# Patient Record
Sex: Female | Born: 1995 | Race: White | Hispanic: No | Marital: Single | Smoking: Former smoker
Health system: Southern US, Community
[De-identification: ages and names within clinical notes are randomized; demographics above are authoritative.]

## PROBLEM LIST (undated history)

## (undated) DIAGNOSIS — J45909 Unspecified asthma, uncomplicated: Secondary | ICD-10-CM

## (undated) DIAGNOSIS — F419 Anxiety disorder, unspecified: Secondary | ICD-10-CM

## (undated) DIAGNOSIS — F329 Major depressive disorder, single episode, unspecified: Secondary | ICD-10-CM

## (undated) DIAGNOSIS — N390 Urinary tract infection, site not specified: Secondary | ICD-10-CM

## (undated) DIAGNOSIS — E282 Polycystic ovarian syndrome: Secondary | ICD-10-CM

## (undated) DIAGNOSIS — F32A Depression, unspecified: Secondary | ICD-10-CM

## (undated) HISTORY — PX: NO PAST SURGERIES: SHX2092

---
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
## 2014-10-07 ENCOUNTER — Emergency Department (HOSPITAL_COMMUNITY)
Admission: EM | Admit: 2014-10-07 | Discharge: 2014-10-07 | Disposition: A | Discharge status: Home / Self Care | Payer: Medicaid Other | Attending: Emergency Medicine | Admitting: Emergency Medicine

## 2014-10-07 ENCOUNTER — Encounter (HOSPITAL_COMMUNITY): Payer: Self-pay | Admitting: Emergency Medicine

## 2014-10-07 DIAGNOSIS — S01512A Laceration without foreign body of oral cavity, initial encounter: Secondary | ICD-10-CM | POA: Diagnosis not present

## 2014-10-07 DIAGNOSIS — Z880 Allergy status to penicillin: Secondary | ICD-10-CM | POA: Insufficient documentation

## 2014-10-07 DIAGNOSIS — S1980XA Other specified injuries of unspecified part of neck, initial encounter: Secondary | ICD-10-CM | POA: Diagnosis present

## 2014-10-07 DIAGNOSIS — S1121XA Laceration without foreign body of pharynx and cervical esophagus, initial encounter: Secondary | ICD-10-CM | POA: Diagnosis not present

## 2014-10-07 DIAGNOSIS — J45909 Unspecified asthma, uncomplicated: Secondary | ICD-10-CM | POA: Insufficient documentation

## 2014-10-07 MED ORDER — LIDOCAINE VISCOUS 2 % MT SOLN
15.0000 mL | Freq: Once | OROMUCOSAL | Status: AC
  Administered 2014-10-07: 15 mL via OROMUCOSAL
  Filled 2014-10-07: qty 15
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
## 2014-10-08 NOTE — ED Provider Notes (Signed)
Medical screening examination/treatment/procedure(s) were performed by non-physician practitioner and as supervising physician I was immediately available for consultation/collaboration.    Anjanae Woehrle, MD 10/08/14 0059 

## 2014-12-26 ENCOUNTER — Emergency Department (HOSPITAL_COMMUNITY)
Admission: EM | Admit: 2014-12-26 | Discharge: 2014-12-26 | Disposition: A | Discharge status: Home / Self Care | Payer: Medicaid Other | Attending: Emergency Medicine | Admitting: Emergency Medicine

## 2014-12-26 ENCOUNTER — Encounter (HOSPITAL_COMMUNITY): Payer: Self-pay | Admitting: Nurse Practitioner

## 2014-12-26 DIAGNOSIS — N939 Abnormal uterine and vaginal bleeding, unspecified: Secondary | ICD-10-CM | POA: Diagnosis present

## 2014-12-26 DIAGNOSIS — Z880 Allergy status to penicillin: Secondary | ICD-10-CM | POA: Diagnosis not present

## 2014-12-26 DIAGNOSIS — Z3202 Encounter for pregnancy test, result negative: Secondary | ICD-10-CM | POA: Insufficient documentation

## 2014-12-26 DIAGNOSIS — J45909 Unspecified asthma, uncomplicated: Secondary | ICD-10-CM | POA: Insufficient documentation

## 2014-12-26 DIAGNOSIS — R102 Pelvic and perineal pain: Secondary | ICD-10-CM | POA: Diagnosis not present

## 2014-12-26 LAB — COMPREHENSIVE METABOLIC PANEL
ALBUMIN: 3.8 g/dL (ref 3.5–5.2)
ALK PHOS: 55 U/L (ref 39–117)
ALT: 21 U/L (ref 0–35)
ANION GAP: 7 (ref 5–15)
AST: 19 U/L (ref 0–37)
BUN: 8 mg/dL (ref 6–23)
CO2: 23 mmol/L (ref 19–32)
Calcium: 9.1 mg/dL (ref 8.4–10.5)
Chloride: 108 mEq/L (ref 96–112)
Creatinine, Ser: 0.61 mg/dL (ref 0.50–1.10)
GLUCOSE: 82 mg/dL (ref 70–99)
Potassium: 4.1 mmol/L (ref 3.5–5.1)
Sodium: 138 mmol/L (ref 135–145)
TOTAL PROTEIN: 7.1 g/dL (ref 6.0–8.3)
Total Bilirubin: 0.3 mg/dL (ref 0.3–1.2)

## 2014-12-26 LAB — CBC
HCT: 37.6 % (ref 36.0–46.0)
Hemoglobin: 12.2 g/dL (ref 12.0–15.0)
MCH: 27.5 pg (ref 26.0–34.0)
MCHC: 32.4 g/dL (ref 30.0–36.0)
MCV: 84.7 fL (ref 78.0–100.0)
Platelets: 311 10*3/uL (ref 150–400)
RBC: 4.44 MIL/uL (ref 3.87–5.11)
RDW: 13 % (ref 11.5–15.5)
WBC: 9.5 10*3/uL (ref 4.0–10.5)

## 2014-12-26 LAB — URINALYSIS, ROUTINE W REFLEX MICROSCOPIC
Bilirubin Urine: NEGATIVE
GLUCOSE, UA: NEGATIVE mg/dL
Ketones, ur: NEGATIVE mg/dL
Leukocytes, UA: NEGATIVE
Nitrite: NEGATIVE
PH: 5 (ref 5.0–8.0)
Protein, ur: NEGATIVE mg/dL
SPECIFIC GRAVITY, URINE: 1.026 (ref 1.005–1.030)
UROBILINOGEN UA: 0.2 mg/dL (ref 0.0–1.0)

## 2014-12-26 LAB — PREGNANCY, URINE: PREG TEST UR: NEGATIVE

## 2014-12-26 LAB — WET PREP, GENITAL
CLUE CELLS WET PREP: NONE SEEN
TRICH WET PREP: NONE SEEN
YEAST WET PREP: NONE SEEN

## 2014-12-26 LAB — URINE MICROSCOPIC-ADD ON

## 2014-12-26 MED ORDER — MEDROXYPROGESTERONE ACETATE 5 MG PO TABS: 10.0000 mg | ORAL_TABLET | Freq: Every day | ORAL | Status: DC

## 2014-12-26 MED ORDER — KETOROLAC TROMETHAMINE 60 MG/2ML IM SOLN
60.0000 mg | Freq: Once | INTRAMUSCULAR | Status: AC
  Administered 2014-12-26: 60 mg via INTRAMUSCULAR
  Filled 2014-12-26: qty 2
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
## 2014-12-26 NOTE — Discharge Instructions (Signed)
Return to the emergency room with worsening of symptoms, new symptoms or with symptoms that are concerning, especially lightheadedness, feeling faint, chest pain, shortness of breath, difficulty breathing, severe worsening of bleeding or abdominal discomfort, blood in stool or vomit.. Provera as prescribed Ibuprofen 400mg  (2 tablets 200mg ) every 5-6 hours for 3-5 days  [facility] Call to make an appointment.    Abnormal Uterine Bleeding Abnormal uterine bleeding can affect women at various stages in life, including teenagers, women in their reproductive years, pregnant women, and women who have reached menopause. Several kinds of uterine bleeding are considered abnormal, including:  Bleeding or spotting between periods.   Bleeding after sexual intercourse.   Bleeding that is heavier or more than normal.   Periods that last longer than usual.  Bleeding after menopause.  Many cases of abnormal uterine bleeding are minor and simple to treat, while others are more serious. Any type of abnormal bleeding should be evaluated by your health care provider. Treatment will depend on the cause of the bleeding. HOME CARE INSTRUCTIONS Monitor your condition for any changes. The following actions may help to alleviate any discomfort you are experiencing:  Avoid the use of tampons and douches as directed by your health care provider.  Change your pads frequently. You should get regular pelvic exams and Pap tests. Keep all follow-up appointments for diagnostic tests as directed by your health care provider.  SEEK MEDICAL CARE IF:   Your bleeding lasts more than 1 week.   You feel dizzy at times.  SEEK IMMEDIATE MEDICAL CARE IF:   You pass out.   You are changing pads every 15 to 30 minutes.   You have abdominal pain.  You have a fever.   You become sweaty or weak.   You are passing large blood clots from the vagina.   You start to feel nauseous and  vomit. MAKE SURE YOU:   Understand these instructions.  Will watch your condition.  Will get help right away if you are not doing well or get worse. Document Released: 12/17/2005 Document Revised: 12/22/2013 Document Reviewed: 07/16/2013 [facility]Exit[facility] Patient Information 2015 [place]Care, [place]. This information is not intended to replace advice given to you by your health care provider. Make sure you discuss any questions you have with your health care provider.   Abdominal Pain, Women Abdominal (stomach, pelvic, or belly) pain can be caused by many things. It is important to tell your doctor:  The location of the pain.  Does it come and go or is it present all the time?  Are there things that start the pain (eating certain foods, exercise)?  Are there other symptoms associated with the pain (fever, nausea, vomiting, diarrhea)? All of this is helpful to know when trying to find the cause of the pain. CAUSES   Stomach: virus or bacteria infection, or ulcer.  Intestine: appendicitis (inflamed appendix), regional ileitis (Crohn's disease), ulcerative colitis (inflamed colon), irritable bowel syndrome, diverticulitis (inflamed diverticulum of the colon), or cancer of the stomach or intestine.  Gallbladder disease or stones in the gallbladder.  Kidney disease, kidney stones, or infection.  Pancreas infection or cancer.  Fibromyalgia (pain disorder).  Diseases of the female organs:  Uterus: fibroid (non-cancerous) tumors or infection.  Fallopian tubes: infection or tubal pregnancy.  Ovary: cysts or tumors.  Pelvic adhesions (scar tissue).  Endometriosis (uterus lining tissue growing in the pelvis and on the pelvic organs).  Pelvic congestion syndrome (female organs filling up with blood just  before the menstrual period).  Pain with the menstrual period.  Pain with ovulation (producing an egg).  Pain with an IUD (intrauterine device, birth control) in the uterus.  Cancer of  the female organs.  Functional pain (pain not caused by a disease, may improve without treatment).  Psychological pain.  Depression. DIAGNOSIS  Your doctor will decide the seriousness of your pain by doing an examination.  Blood tests.  X-rays.  Ultrasound.  CT scan (computed tomography, special type of X-ray).  MRI (magnetic resonance imaging).  Cultures, for infection.  Barium enema (dye inserted in the large intestine, to better view it with X-rays).  Colonoscopy (looking in intestine with a lighted tube).  Laparoscopy (minor surgery, looking in abdomen with a lighted tube).  Major abdominal exploratory surgery (looking in abdomen with a large incision). TREATMENT  The treatment will depend on the cause of the pain.   Many cases can be observed and treated at home.  Over-the-counter medicines recommended by your caregiver.  Prescription medicine.  Antibiotics, for infection.  Birth control pills, for painful periods or for ovulation pain.  Hormone treatment, for endometriosis.  Nerve blocking injections.  Physical therapy.  Antidepressants.  Counseling with a psychologist or psychiatrist.  Minor or major surgery. HOME CARE INSTRUCTIONS   Do not take laxatives, unless directed by your caregiver.  Take over-the-counter pain medicine only if ordered by your caregiver. Do not take aspirin because it can cause an upset stomach or bleeding.  Try a clear liquid diet (broth or water) as ordered by your caregiver. Slowly move to a bland diet, as tolerated, if the pain is related to the stomach or intestine.  Have a thermometer and take your temperature several times a day, and record it.  Bed rest and sleep, if it helps the pain.  Avoid sexual intercourse, if it causes pain.  Avoid stressful situations.  Keep your follow-up appointments and tests, as your caregiver orders.  If the pain does not go away with medicine or surgery, you may  try:  Acupuncture.  Relaxation exercises (yoga, meditation).  Group therapy.  Counseling. SEEK MEDICAL CARE IF:   You notice certain foods cause stomach pain.  Your home care treatment is not helping your pain.  You need stronger pain medicine.  You want your IUD removed.  You feel faint or lightheaded.  You develop nausea and vomiting.  You develop a rash.  You are having side effects or an allergy to your medicine. SEEK IMMEDIATE MEDICAL CARE IF:   Your pain does not go away or gets worse.  You have a fever.  Your pain is felt only in portions of the abdomen. The right side could possibly be appendicitis. The left lower portion of the abdomen could be colitis or diverticulitis.  You are passing blood in your stools (bright red or black tarry stools, with or without vomiting).  You have blood in your urine.  You develop chills, with or without a fever.  You pass out. MAKE SURE YOU:   Understand these instructions.  Will watch your condition.  Will get help right away if you are not doing well or get worse. Document Released: 10/14/2007 Document Revised: 05/03/2014 Document Reviewed: 11/03/2009 [facility]ExitCare Patient Information 2015 [place]ExitCare, [place]LLC. This information is not intended to replace advice given to you by your health care provider. Make sure you discuss any questions you have with your health care provider.

## 2014-12-26 NOTE — ED Provider Notes (Signed)
CSN: [id number]     Arrival date & time 12/26/14  1234 History   First MD Initiated Contact with Patient 12/26/14 1533     Chief Complaint  Patient presents with  . Vaginal Bleeding     (Consider location/radiation/quality/duration/timing/severity/associated sxs/prior Treatment) HPI  Laura Whitaker is a 18 y.o. female with PMH of asthma presenting with heavy vaginal bleeding with clots and pelvic pain for 3 days. Patient states she is using one pad every 1-2 hours. Patient states she has a history of amenorrhea but 2 weeks ago after sexual intercourse she did have some vaginal bleeding that is resolved. Patient states she has never had a regular period. She has contacted OB/GYn. She stated they did do anything for her. She is not taking any oral contraceptive pills or estrogens. She does engage in intercourse and uses condoms regularly. She denies ever having an STD. Patient states the pelvic pain is a "discomfort" she denies anything making it better or worse. She has not taken anything for it. She denies any fevers, chills, nausea, vomiting, diarrhea. Patient denies problems with bleeding before. Patient states she has never been pregnant. Patient eating and drinking like normal. No urinary complaints. Patient endorses mild lightheadedness with standing. No chest pain, shortness of breath or syncope.   Past Medical History  Diagnosis Date  . Asthma    History reviewed. No pertinent past surgical history. Family History  Problem Relation Age of Onset  . Diabetes Mother   . Hypertension Other    History  Substance Use Topics  . Smoking status: Never Smoker   . Smokeless tobacco: Never Used  . Alcohol Use: No   OB History    No data available     Review of Systems  Constitutional: Negative for fever and chills.  HENT: Negative for congestion and rhinorrhea.   Eyes: Negative for visual disturbance.  Respiratory: Negative for cough and shortness of breath.   Cardiovascular:  Negative for chest pain and palpitations.  Gastrointestinal: Negative for nausea, vomiting and diarrhea.  Genitourinary: Positive for pelvic pain. Negative for dysuria and hematuria.  Musculoskeletal: Negative for back pain and gait problem.  Skin: Negative for rash.  Neurological: Negative for weakness and headaches.      Allergies  Amoxicillin; Penicillins; and Prednisone  Home Medications   Prior to Admission medications   Medication Sig Start Date End Date Taking? Authorizing Provider  ibuprofen (ADVIL,MOTRIN) 200 MG tablet Take 200 mg by mouth every 6 (six) hours as needed. PAIN   Yes Historical Provider, MD  benzonatate (TESSALON) 100 MG capsule Take 2 capsules (200 mg total) by mouth 2 (two) times daily as needed for cough. Patient not taking: Reported on 12/26/2014 12/31/12   Roxy Horsemanobert Browning, PA-C  medroxyPROGESTERone (PROVERA) 5 MG tablet Take 2 tablets (10 mg total) by mouth daily. 12/26/14   Louann SjogrenVictoria L Everson Mott, PA-C  oseltamivir (TAMIFLU) 75 MG capsule Take 1 capsule (75 mg total) by mouth every 12 (twelve) hours. Patient not taking: Reported on 12/26/2014 12/31/12   Roxy Horsemanobert Browning, PA-C   BP 109/61 mmHg  Pulse 76  Temp(Src) 98 F (36.7 C) (Oral)  Resp 20  Wt 220 lb (99.791 kg)  SpO2 100% Physical Exam  Constitutional: She appears well-developed and well-nourished. No distress.  HENT:  Head: Normocephalic and atraumatic.  Eyes: Conjunctivae and EOM are normal. Right eye exhibits no discharge. Left eye exhibits no discharge.  Cardiovascular: Normal rate, regular rhythm and normal heart sounds.   Pulmonary/Chest: Effort normal and  breath sounds normal. No respiratory distress. She has no wheezes.  Abdominal: Soft. Bowel sounds are normal. She exhibits no distension.  Mild right and left sided pelvic pain without rebound, rigidity, guarding. No CVA tenderness.  Genitourinary:  Cervix pink without lesions. Os closed. No CMT mild right and left adnexal tenderness without  masses. Large amount of blood in vagina. Blood does not reaccumulate quickly. No foul odor. Nursing tech in room for exam.  Neurological: She is alert. She exhibits normal muscle tone. Coordination normal.  Skin: Skin is warm and dry. She is not diaphoretic.  Nursing note and vitals reviewed.   ED Course  Procedures (including critical care time) Labs Review Labs Reviewed  WET PREP, GENITAL - Abnormal; Notable for the following:    WBC, Wet Prep HPF POC FEW (*)    All other components within normal limits  URINALYSIS, ROUTINE W REFLEX MICROSCOPIC - Abnormal; Notable for the following:    Hgb urine dipstick LARGE (*)    All other components within normal limits  GC/CHLAMYDIA PROBE AMP  CBC  COMPREHENSIVE METABOLIC PANEL  RPR  PREGNANCY, URINE  URINE MICROSCOPIC-ADD ON  HIV ANTIBODY (ROUTINE TESTING)    Imaging Review No results found.   EKG Interpretation None      MDM   Final diagnoses:  Vaginal bleeding  Pelvic pain in female   Patient with 3 day history of heavy vaginal bleeding with clots as well as bilateral lower pelvic pain that is described as discomfort. Patient states she has never had a period before. She does have a history of amenorrhea. She is not having OB/GYN. Patient denies chest pain, shortness of breath. She does endorse mild lightheadedness with standing. VSS. Mild abdominal tenderness without signs of peritonitis. Pelvic exam with normal discomfort. Blood does not reaccumulate rapidly. Patient's lab without anemia. No electrolyte abnormalities. Urine is uninfected. Patient with few white blood cells and no CMT on exam. I doubt PID. STD panel pending. Spoke with on-call OB/GYN who recommended Provera. Patient to [facility]. I doubt acute abdominal process. Presentation, exam not consistent with torsion.  Discussed return precautions with patient. Discussed all results and patient verbalizes understanding and agrees with plan.  Case  has been discussed with Dr. Rubin PayorPickering who agrees with the above plan and to discharge.    Louann SjogrenVictoria L Loney Domingo, PA-C 12/27/14 0107  Juliet RudeNathan R. Rubin PayorPickering, MD 12/29/14 2109

## 2014-12-27 ENCOUNTER — Telehealth: Payer: Self-pay | Admitting: *Deleted

## 2014-12-27 LAB — GC/CHLAMYDIA PROBE AMP
CT PROBE, AMP APTIMA: NEGATIVE
GC PROBE AMP APTIMA: NEGATIVE

## 2014-12-27 LAB — HIV ANTIBODY (ROUTINE TESTING W REFLEX): HIV: NONREACTIVE

## 2014-12-27 LAB — RPR

## 2014-12-27 NOTE — Telephone Encounter (Signed)
Pt called the nurse line requesting to speak with a nurse concerning medication and bleeding.   Contacted patient and discussed patient's concern with bleeding and pain.  Pt is concerned it is not her period and is having clots that fill the pad.  Appointment move up to Wednesday 12/29/14 @ 12:40pm.  Pt will maked sure she can come to this appointment.  Informed patient that this is our only open slot for this week.  Pt verbalizes understanding.

## 2014-12-29 ENCOUNTER — Ambulatory Visit (INDEPENDENT_AMBULATORY_CARE_PROVIDER_SITE_OTHER): Payer: Medicaid Other | Admitting: Family Medicine

## 2014-12-29 VITALS — BP 131/69 | HR 73 | Temp 97.8°F | Ht 68.0 in | Wt 220.4 lb

## 2014-12-29 DIAGNOSIS — N911 Secondary amenorrhea: Secondary | ICD-10-CM

## 2014-12-29 MED ORDER — MEDROXYPROGESTERONE ACETATE 5 MG PO TABS: 10.0000 mg | ORAL_TABLET | Freq: Every day | ORAL | Status: DC

## 2014-12-29 MED ORDER — NORGESTIMATE-ETH ESTRADIOL 0.25-35 MG-MCG PO TABS: 1.0000 | ORAL_TABLET | Freq: Every day | ORAL | Status: DC

## 2014-12-29 NOTE — Progress Notes (Signed)
   Subjective:    Patient ID: Laura Whitaker, female    DOB: 1996/05/11, 18 y.o.   MRN: [id number]  HPI 18yo G1P0010 started having periods at 18 yo.  Had menses every 30 days for 4 months, then stopped having periods.  Does have vaginal bleeding after having antibiotics - bleeds for a couple of hours, then stops.  Was on abx 3 weeks for a respiratory infection.  On last day of antibiotics, was sexually active and had bleeding.  A few days later, had 3 days of bleeding - passed large clot.  Went to ED, was given provera, which has decreased bleeding.  Review of Systems  Constitutional: Negative for fever, chills and fatigue.  Respiratory: Negative for choking, shortness of breath and wheezing.   Gastrointestinal: Negative for nausea, vomiting, abdominal pain, diarrhea and constipation.  Endocrine: Negative for cold intolerance, heat intolerance, polydipsia, polyphagia and polyuria.  Genitourinary: Negative for dysuria, urgency, frequency and dyspareunia.  Neurological: Negative for dizziness, light-headedness, numbness and headaches.       Objective:   Physical Exam  Constitutional: She appears well-developed and well-nourished.  HENT:  Head: Normocephalic and atraumatic.  Cardiovascular: Normal rate and regular rhythm.  Exam reveals no gallop and no friction rub.   No murmur heard. Pulmonary/Chest: Effort normal and breath sounds normal. No respiratory distress. She has no wheezes. She has no rales. She exhibits no tenderness.  Abdominal: Soft. Bowel sounds are normal. She exhibits no distension and no mass. There is no tenderness. There is no rebound and no guarding.  Genitourinary: No labial fusion. There is no rash, tenderness, lesion or injury on the right labia. There is no rash, tenderness, lesion or injury on the left labia. Uterus is not deviated, not enlarged, not fixed and not tender. Cervix exhibits no motion tenderness, no discharge and no friability. Right adnexum displays no  mass, no tenderness and no fullness. Left adnexum displays no mass, no tenderness and no fullness. No erythema, tenderness or bleeding in the vagina. No foreign body around the vagina. No signs of injury around the vagina. No vaginal discharge found.  Dark blood in vaginal vault  Skin: Skin is warm and dry.  Psychiatric: She has a normal mood and affect. Her behavior is normal. Judgment and thought content normal.      Assessment & Plan:   Problem List Items Addressed This Visit    Secondary amenorrhea - Primary   Relevant Orders      TSH      FSH      LH      Hemoglobin A1c      Testosterone, free, total     Lab testing done.  Patient to finish course of provera, then start Sprintec.  Possible adverse reactions discussed.  F/u 4-6 months.

## 2014-12-29 NOTE — Patient Instructions (Signed)
Secondary Amenorrhea  Secondary amenorrhea is the stopping of menstrual flow for 3-6 months in a female who has previously had periods. There are many possible causes. Most of these causes are not serious. Usually, treating the underlying problem causing the loss of menses will return your periods to normal. CAUSES  Some common and uncommon causes of not menstruating include:  Malnutrition.  Low blood sugar (hypoglycemia).  Polycystic ovary disease.  Stress or fear.  Breastfeeding.  Hormone imbalance.  Ovarian failure.  Medicines.  Extreme obesity.  Cystic fibrosis.  Low body weight or drastic weight reduction from any cause.  Early menopause.  Removal of ovaries or uterus.  Contraceptives.  Illness.  Long-term (chronic) illnesses.  Cushing syndrome.  Thyroid problems.  Birth control pills, patches, or vaginal rings for birth control. RISK FACTORS You may be at greater risk of secondary amenorrhea if:  You have a family history of this condition.  You have an eating disorder.  You do athletic training. DIAGNOSIS  A diagnosis is made by your health care provider taking a medical history and doing a physical exam. This will include a pelvic exam to check for problems with your reproductive organs. Pregnancy must be ruled out. Often, numerous blood tests are done to measure different hormones in the body. Urine testing may be done. Specialized exams (ultrasound, CT scan, MRI, or hysteroscopy) may have to be done as well as measuring the body mass index (BMI). TREATMENT  Treatment depends on the cause of the amenorrhea. If an eating disorder is present, this can be treated with an adequate diet and therapy. Chronic illnesses may improve with treatment of the illness. Amenorrhea may be corrected with medicines, lifestyle changes, or surgery. If the amenorrhea cannot be corrected, it is sometimes possible to create a false menstruation with medicines. HOME CARE  INSTRUCTIONS  Maintain a healthy diet.  Manage weight problems.  Exercise regularly but not excessively.  Get adequate sleep.  Manage stress.  Be aware of changes in your menstrual cycle. Keep a record of when your periods occur. Note the date your period starts, how long it lasts, and any problems. SEEK MEDICAL CARE IF: Your symptoms do not get better with treatment. Document Released: 01/28/2007 Document Revised: 08/19/2013 Document Reviewed: 06/04/2013 Exit[employer] Patient Information 2015 [employer]. This information is not intended to replace advice given to you by your health care provider. Make sure you discuss any questions you have with your health care provider.  

## 2014-12-30 LAB — LUTEINIZING HORMONE: LH: 4.6 m[IU]/mL

## 2014-12-30 LAB — HEMOGLOBIN A1C
Hgb A1c MFr Bld: 5.3 % (ref <5.7)
MEAN PLASMA GLUCOSE: 105 mg/dL (ref <117)

## 2014-12-30 LAB — FOLLICLE STIMULATING HORMONE: FSH: 4.9 m[IU]/mL

## 2014-12-30 LAB — TESTOSTERONE, FREE, TOTAL, SHBG
SEX HORMONE BINDING: 26 nmol/L (ref 18–114)
TESTOSTERONE FREE: 5.1 pg/mL (ref 0.6–6.8)
TESTOSTERONE-% FREE: 2 % (ref 0.4–2.4)
Testosterone: 25 ng/dL (ref 10–70)

## 2014-12-30 LAB — TSH: TSH: 1.356 u[IU]/mL (ref 0.350–4.500)

## 2015-01-06 ENCOUNTER — Telehealth: Payer: Self-pay | Admitting: *Deleted

## 2015-01-06 NOTE — Telephone Encounter (Signed)
Called patients mobile number and heard message that the patient you are calling is currently unavailable. Called home number and spoke with patient informed her of results.

## 2015-01-06 NOTE — Telephone Encounter (Signed)
-----   Message from Levie HeritageJacob J Stinson, DO sent at 01/06/2015  8:10 AM EST ----- Please let pt know that labs were all normal.

## 2015-01-21 ENCOUNTER — Encounter: Payer: Medicaid Other | Admitting: Obstetrics & Gynecology

## 2017-03-04 ENCOUNTER — Emergency Department (HOSPITAL_COMMUNITY)
Admission: EM | Admit: 2017-03-04 | Discharge: 2017-03-04 | Disposition: A | Discharge status: Home / Self Care | Payer: Self-pay | Attending: Emergency Medicine | Admitting: Emergency Medicine

## 2017-03-04 ENCOUNTER — Encounter (HOSPITAL_COMMUNITY): Payer: Self-pay | Admitting: Nurse Practitioner

## 2017-03-04 ENCOUNTER — Emergency Department (HOSPITAL_COMMUNITY): Payer: Self-pay

## 2017-03-04 DIAGNOSIS — R102 Pelvic and perineal pain: Secondary | ICD-10-CM

## 2017-03-04 DIAGNOSIS — N939 Abnormal uterine and vaginal bleeding, unspecified: Secondary | ICD-10-CM | POA: Insufficient documentation

## 2017-03-04 DIAGNOSIS — J45909 Unspecified asthma, uncomplicated: Secondary | ICD-10-CM | POA: Insufficient documentation

## 2017-03-04 HISTORY — DX: Polycystic ovarian syndrome: E28.2

## 2017-03-04 LAB — COMPREHENSIVE METABOLIC PANEL
ALBUMIN: 4.3 g/dL (ref 3.5–5.0)
ALT: 25 U/L (ref 14–54)
ANION GAP: 6 (ref 5–15)
AST: 24 U/L (ref 15–41)
Alkaline Phosphatase: 55 U/L (ref 38–126)
BILIRUBIN TOTAL: 0.7 mg/dL (ref 0.3–1.2)
BUN: 16 mg/dL (ref 6–20)
CHLORIDE: 106 mmol/L (ref 101–111)
CO2: 27 mmol/L (ref 22–32)
Calcium: 9.5 mg/dL (ref 8.9–10.3)
Creatinine, Ser: 0.67 mg/dL (ref 0.44–1.00)
GFR calc Af Amer: >60 mL/min (ref >60)
GFR calc non Af Amer: >60 mL/min (ref >60)
GLUCOSE: 85 mg/dL (ref 65–99)
POTASSIUM: 3.9 mmol/L (ref 3.5–5.1)
SODIUM: 139 mmol/L (ref 135–145)
TOTAL PROTEIN: 7.6 g/dL (ref 6.5–8.1)

## 2017-03-04 LAB — URINALYSIS, ROUTINE W REFLEX MICROSCOPIC
BACTERIA UA: NONE SEEN
Bilirubin Urine: NEGATIVE
Glucose, UA: NEGATIVE mg/dL
Ketones, ur: NEGATIVE mg/dL
Leukocytes, UA: NEGATIVE
NITRITE: NEGATIVE
Protein, ur: NEGATIVE mg/dL
Specific Gravity, Urine: 1.025 (ref 1.005–1.030)
pH: 5 (ref 5.0–8.0)

## 2017-03-04 LAB — I-STAT BETA HCG BLOOD, ED (MC, WL, AP ONLY)

## 2017-03-04 LAB — CBC
HEMATOCRIT: 41 % (ref 36.0–46.0)
HEMOGLOBIN: 13.2 g/dL (ref 12.0–15.0)
MCH: 26.8 pg (ref 26.0–34.0)
MCHC: 32.2 g/dL (ref 30.0–36.0)
MCV: 83.3 fL (ref 78.0–100.0)
Platelets: 297 10*3/uL (ref 150–400)
RBC: 4.92 MIL/uL (ref 3.87–5.11)
RDW: 15.3 % (ref 11.5–15.5)
WBC: 10.5 10*3/uL (ref 4.0–10.5)

## 2017-03-04 LAB — WET PREP, GENITAL
Clue Cells Wet Prep HPF POC: NONE SEEN
Sperm: NONE SEEN
Trich, Wet Prep: NONE SEEN
YEAST WET PREP: NONE SEEN

## 2017-03-04 NOTE — ED Notes (Signed)
Patient transported to Ultrasound 

## 2017-03-04 NOTE — ED Triage Notes (Signed)
Pt presents with c/o vaginal bleeding. The bleeding began today. She reports nausea, breast pain, abd pain intermittent over the past week. She has tried cold packs to her abdomen with no relief. She has a history of PCOS and has not had a period in years. She was diagnosed with PCOS at the health department years ago but did not follow up for treatment due to lack of insurance

## 2017-03-04 NOTE — ED Provider Notes (Signed)
[facility]-EMERGENCY DEPT Provider Note   CSN: [id number] Arrival date & time: 03/04/17  1618   By signing my name below, I, Laura Whitaker, attest that this documentation has been prepared under the direction and in the presence of Roxy Horseman, PA-C. Electronically Signed: Talbert Whitaker, Scribe. 03/04/17. 6:52 PM.    History   Chief Complaint Chief Complaint  Patient presents with  . Vaginal Bleeding    HPI Laura Whitaker is a 21 y.o. female with h/o PCOS, asthma who presents to the Emergency Department complaining of mild, persistent vaginal bleeding that started today. Pt is using pads, she has only used 1 today. Pt has associated sharp hip pain "as if someone is driving in stakes at her hips" that radiates to her back. Pt reports never having had a period.  She was diagnosed with PCOS at the health department years ago but did not follow up for treatment due to lack of insurance. Pt denies lightheadedness, dizziness, fever.    The history is provided by the patient. No language interpreter was used.    Past Medical History:  Diagnosis Date  . Asthma   . PCOS (polycystic ovarian syndrome)     Patient Active Problem List   Diagnosis Date Noted  . Secondary amenorrhea 12/29/2014    History reviewed. No pertinent surgical history.  OB History    No data available       Home Medications    Prior to Admission medications   Medication Sig Start Date End Date Taking? Authorizing Provider  benzonatate (TESSALON) 100 MG capsule Take 2 capsules (200 mg total) by mouth 2 (two) times daily as needed for cough. Patient not taking: Reported on 12/26/2014 12/31/12   Roxy Horseman, PA-C  ibuprofen (ADVIL,MOTRIN) 200 MG tablet Take 200 mg by mouth every 6 (six) hours as needed. PAIN    Historical Provider, MD  medroxyPROGESTERone (PROVERA) 5 MG tablet Take 2 tablets (10 mg total) by mouth daily. 12/29/14 01/04/15  Levie Heritage, DO  norgestimate-ethinyl estradiol  (ORTHO-CYCLEN,SPRINTEC,PREVIFEM) 0.25-35 MG-MCG tablet Take 1 tablet by mouth daily. 12/29/14   Levie Heritage, DO    Family History Family History  Problem Relation Age of Onset  . Diabetes Mother   . Hypertension Other     Social History Social History  Substance Use Topics  . Smoking status: Never Smoker  . Smokeless tobacco: Never Used  . Alcohol use No     Allergies   Amoxicillin; Penicillins; and Prednisone   Review of Systems Review of Systems  Constitutional: Negative for fever.  Genitourinary: Positive for vaginal bleeding and vaginal pain.  Musculoskeletal: Positive for myalgias.  Neurological: Negative for dizziness and light-headedness.     Physical Exam Updated Vital Signs BP 131/82   Pulse 77   Temp 98.9 F (37.2 C) (Oral)   Resp 17   LMP 03/04/2017   SpO2 100%   Physical Exam  Constitutional: She is oriented to person, place, and time. She appears well-developed and well-nourished. No distress.  HENT:  Head: Normocephalic and atraumatic.  Mouth/Throat: Oropharynx is clear and moist.  Eyes: Pupils are equal, round, and reactive to light.  Neck: Normal range of motion. Neck supple.  Cardiovascular: Normal rate, regular rhythm and normal heart sounds.  Exam reveals no gallop and no friction rub.   No murmur heard. Pulmonary/Chest: Effort normal and breath sounds normal. No respiratory distress. She has no wheezes.  Abdominal: Soft. Bowel sounds are normal. She exhibits no distension. There is no  tenderness.  Genitourinary:  Genitourinary Comments: Pelvic exam chaperoned by female ER tech, no right adnexal tenderness, moderate left adnexal and uterine tenderness, no vaginal discharge, scant bleeding, no CMT or friability, no foreign body, no injury to the external genitalia, no other significant findings   Neurological: She is alert and oriented to person, place, and time. She exhibits normal muscle tone. Coordination normal.  Skin: Skin is warm  and dry. No rash noted. No erythema.  Psychiatric: She has a normal mood and affect. Her behavior is normal.  Nursing note and vitals reviewed.    ED Treatments / Results   DIAGNOSTIC STUDIES: Oxygen Saturation is 100% on room air, normal by my interpretation.    COORDINATION OF CARE: 6:50 PM Discussed treatment plan with pt at bedside and pt agreed to plan, which includes pelvic exam and potential [place] of ovaries. Pt will be referred to OB/GYN.   Labs (all labs ordered are listed, but only abnormal results are displayed) Labs Reviewed  COMPREHENSIVE METABOLIC PANEL  CBC  URINALYSIS, ROUTINE W REFLEX MICROSCOPIC  I-STAT BETA HCG BLOOD, ED ([facility], [facility], [facility] ONLY)    EKG  EKG Interpretation None       Radiology US Transvaginal Non-ob  Result Date: 03/04/2017 CLINICAL DATA:  Left adnexal pain EXAM: TRANSABDOMINAL AND TRANSVAGINAL ULTRASOUND OF PELVIS DOPPLER ULTRASOUND OF OVARIES TECHNIQUE: Both transabdominal and transvaginal ultrasound examinations of the pelvis were performed. Transabdominal technique was performed for global imaging of the pelvis including uterus, ovaries, adnexal regions, and pelvic cul-de-sac. It was necessary to proceed with endovaginal exam following the transabdominal exam to visualize the endometrium and ovaries. Color and duplex Doppler ultrasound was utilized to evaluate blood flow to the ovaries. COMPARISON:  None. FINDINGS: Uterus Measurements: 8 x 4.4 x 5.8 cm. No fibroids or other mass visualized. Endometrium Thickness: 12 mm.  No focal abnormality visualized. Right ovary Measurements: 3.9 x 2.3 x 2.4 cm. Normal appearance/no adnexal mass. Left ovary Measurements: 2.2 x 2.4 x 2.7 cm. Normal appearance/no adnexal mass. Pulsed Doppler evaluation of both ovaries demonstrates normal low-resistance arterial and venous waveforms. Other findings No abnormal free fluid. IMPRESSION: 1. Normal pelvic ultrasound. 2. No evidence of ovarian torsion. Electronically Signed    By: Elige Ko   On: 03/04/2017 20:56   US Pelvis Complete  Result Date: 03/04/2017 CLINICAL DATA:  Left adnexal pain EXAM: TRANSABDOMINAL AND TRANSVAGINAL ULTRASOUND OF PELVIS DOPPLER ULTRASOUND OF OVARIES TECHNIQUE: Both transabdominal and transvaginal ultrasound examinations of the pelvis were performed. Transabdominal technique was performed for global imaging of the pelvis including uterus, ovaries, adnexal regions, and pelvic cul-de-sac. It was necessary to proceed with endovaginal exam following the transabdominal exam to visualize the endometrium and ovaries. Color and duplex Doppler ultrasound was utilized to evaluate blood flow to the ovaries. COMPARISON:  None. FINDINGS: Uterus Measurements: 8 x 4.4 x 5.8 cm. No fibroids or other mass visualized. Endometrium Thickness: 12 mm.  No focal abnormality visualized. Right ovary Measurements: 3.9 x 2.3 x 2.4 cm. Normal appearance/no adnexal mass. Left ovary Measurements: 2.2 x 2.4 x 2.7 cm. Normal appearance/no adnexal mass. Pulsed Doppler evaluation of both ovaries demonstrates normal low-resistance arterial and venous waveforms. Other findings No abnormal free fluid. IMPRESSION: 1. Normal pelvic ultrasound. 2. No evidence of ovarian torsion. Electronically Signed   By: Elige Ko   On: 03/04/2017 20:56   [place] Art/ven Flow Abd Pelv Doppler  Result Date: 03/04/2017 CLINICAL DATA:  Left adnexal pain EXAM: TRANSABDOMINAL AND TRANSVAGINAL ULTRASOUND OF PELVIS DOPPLER  ULTRASOUND OF OVARIES TECHNIQUE: Both transabdominal and transvaginal ultrasound examinations of the pelvis were performed. Transabdominal technique was performed for global imaging of the pelvis including uterus, ovaries, adnexal regions, and pelvic cul-de-sac. It was necessary to proceed with endovaginal exam following the transabdominal exam to visualize the endometrium and ovaries. Color and duplex Doppler ultrasound was utilized to evaluate blood flow to the ovaries. COMPARISON:  None.  FINDINGS: Uterus Measurements: 8 x 4.4 x 5.8 cm. No fibroids or other mass visualized. Endometrium Thickness: 12 mm.  No focal abnormality visualized. Right ovary Measurements: 3.9 x 2.3 x 2.4 cm. Normal appearance/no adnexal mass. Left ovary Measurements: 2.2 x 2.4 x 2.7 cm. Normal appearance/no adnexal mass. Pulsed Doppler evaluation of both ovaries demonstrates normal low-resistance arterial and venous waveforms. Other findings No abnormal free fluid. IMPRESSION: 1. Normal pelvic ultrasound. 2. No evidence of ovarian torsion. Electronically Signed   By: Elige KoHetal  Patel   On: 03/04/2017 20:56    Procedures Procedures (including critical care time)  Medications Ordered in ED Medications - No data to display   Initial Impression / Assessment and Plan / ED Course  I have reviewed the triage vital signs and the nursing notes.  Pertinent labs & imaging results that were available during my care of the patient were reviewed by me and considered in my medical decision making (see chart for details).     Patient presents to the emergency department with vaginal bleeding. She states that the symptoms started earlier today. She reports some crampy lower abdominal pain. She has a history of PCO S. She states that she does not normally have vaginal bleeding. She denies any vaginal discharge. She denies feeling lightheaded or dizzy. Denies any shortness of breath.  Patient does have a scant amount of bleeding, no active hemorrhage, no visible trauma. She is also somewhat tender in the left adnexa, will check pelvic ultrasound. Vital signs and laboratory workup is reassuring. Anticipate discharge to home with OB/GYN follow-up pending ultrasound.   Final Clinical Impressions(s) / ED Diagnoses   Final diagnoses:  Vaginal bleeding    New Prescriptions Discharge Medication List as of 03/04/2017  9:46 PM     I personally performed the services described in this documentation, which was scribed in my  presence. The recorded information has been reviewed and is accurate.       Roxy Horsemanobert Patsy Varma, PA-C 03/04/17 2219    Rolland PorterMark James, MD 03/20/17 [phone]

## 2017-03-08 LAB — GC/CHLAMYDIA PROBE AMP (~~LOC~~) NOT AT ARMC
Chlamydia: NEGATIVE
Neisseria Gonorrhea: NEGATIVE

## 2018-08-05 LAB — OB RESULTS CONSOLE GC/CHLAMYDIA: Gonorrhea: NEGATIVE

## 2018-08-05 LAB — OB RESULTS CONSOLE RUBELLA ANTIBODY, IGM: Rubella: IMMUNE

## 2018-09-17 DIAGNOSIS — F32A Depression, unspecified: Secondary | ICD-10-CM | POA: Diagnosis present

## 2018-09-17 DIAGNOSIS — F329 Major depressive disorder, single episode, unspecified: Secondary | ICD-10-CM | POA: Diagnosis present

## 2018-09-17 DIAGNOSIS — O99340 Other mental disorders complicating pregnancy, unspecified trimester: Secondary | ICD-10-CM | POA: Diagnosis present

## 2018-10-05 LAB — OB RESULTS CONSOLE GC/CHLAMYDIA: Chlamydia: NEGATIVE

## 2018-10-13 LAB — OB RESULTS CONSOLE HEPATITIS B SURFACE ANTIGEN: Hepatitis B Surface Ag: NEGATIVE

## 2018-10-13 LAB — OB RESULTS CONSOLE RPR: RPR: NONREACTIVE

## 2018-10-13 LAB — OB RESULTS CONSOLE HIV ANTIBODY (ROUTINE TESTING): HIV: NONREACTIVE

## 2018-12-20 IMAGING — US US TRANSVAGINAL NON-OB
1 series · 14 of 25 positions shown · non-contrast
Comparison: None.

CLINICAL DATA: Left adnexal pain

EXAM:
TRANSABDOMINAL AND TRANSVAGINAL ULTRASOUND OF PELVIS
DOPPLER ULTRASOUND OF OVARIES
TECHNIQUE: Both transabdominal and transvaginal ultrasound examinations of the
pelvis were performed. Transabdominal technique was performed for
global imaging of the pelvis including uterus, ovaries, adnexal
regions, and pelvic cul-de-sac.
It was necessary to proceed with endovaginal exam following the
transabdominal exam to visualize the endometrium and ovaries. Color
and duplex Doppler ultrasound was utilized to evaluate blood flow to
the ovaries.

[Series 1: us transvaginal non-ob · 0.18mm/px · 14 of 72 slices shown]
[im 1/72]
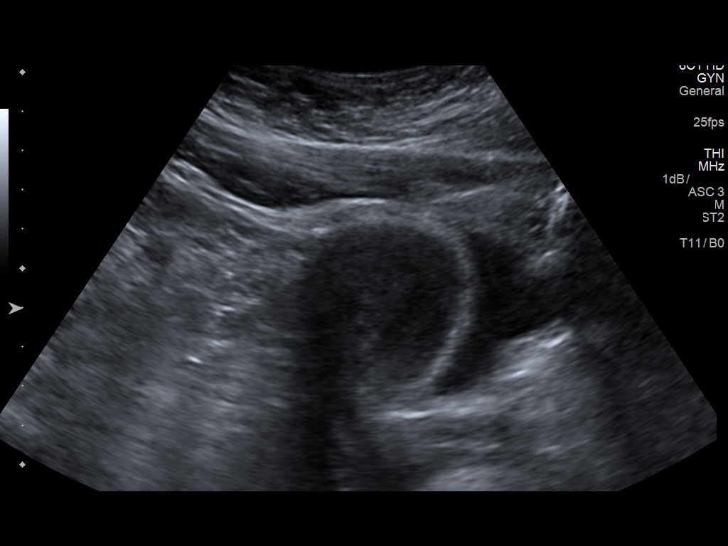
[im 6/72]
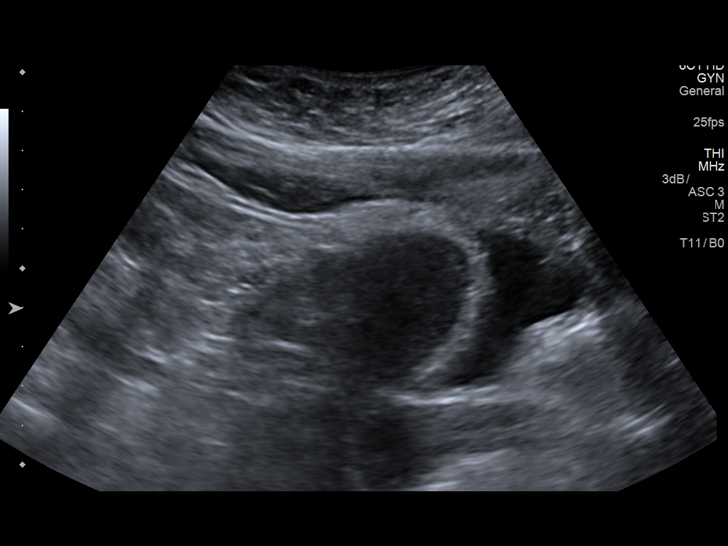
[im 12/72]
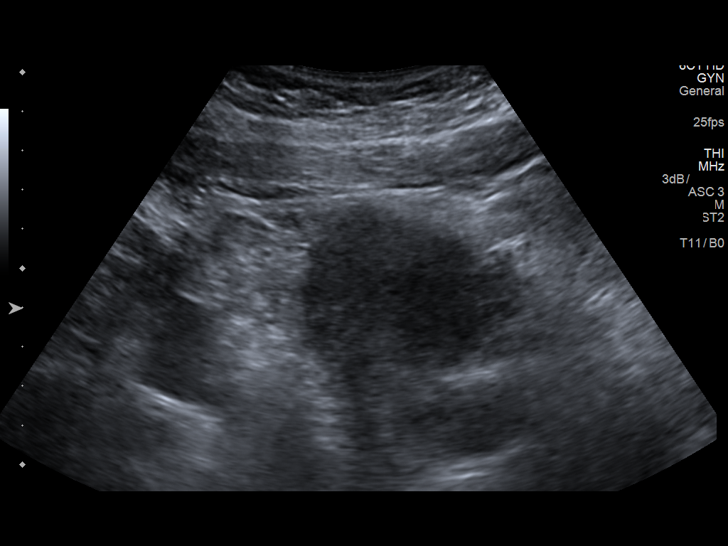
[im 18/72]
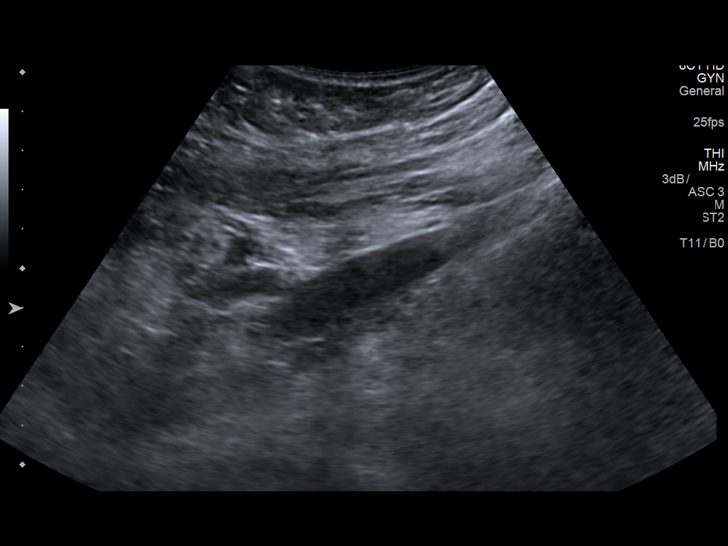
[im 24/72]
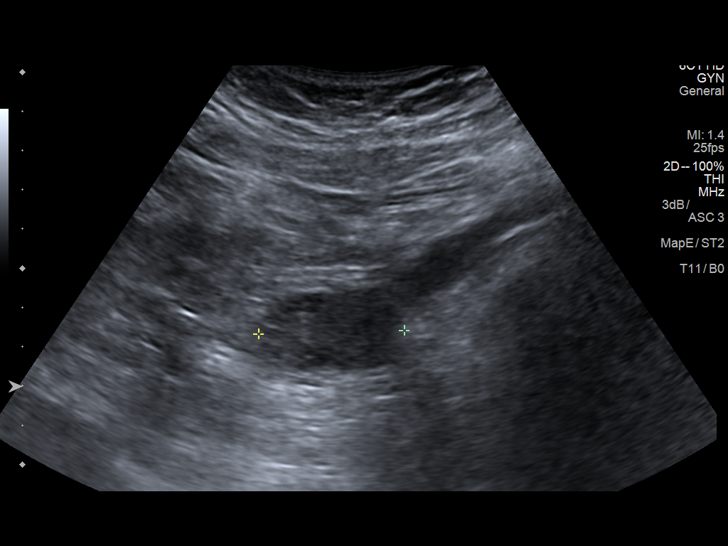
[im 27/72]
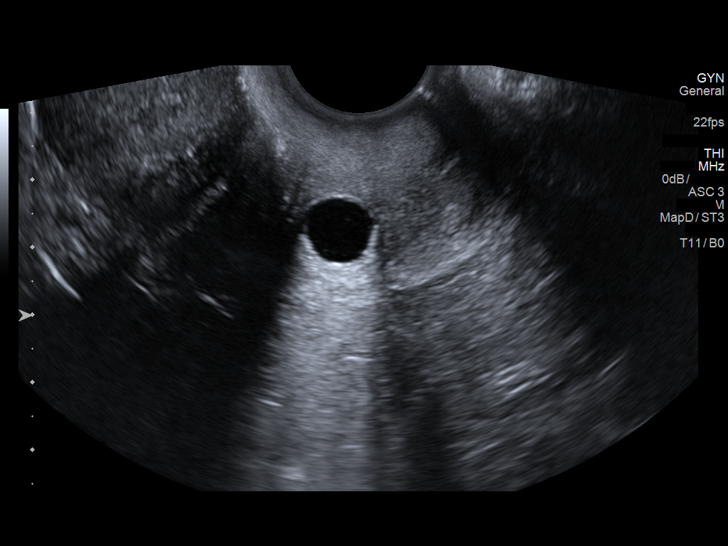
[im 33/72]
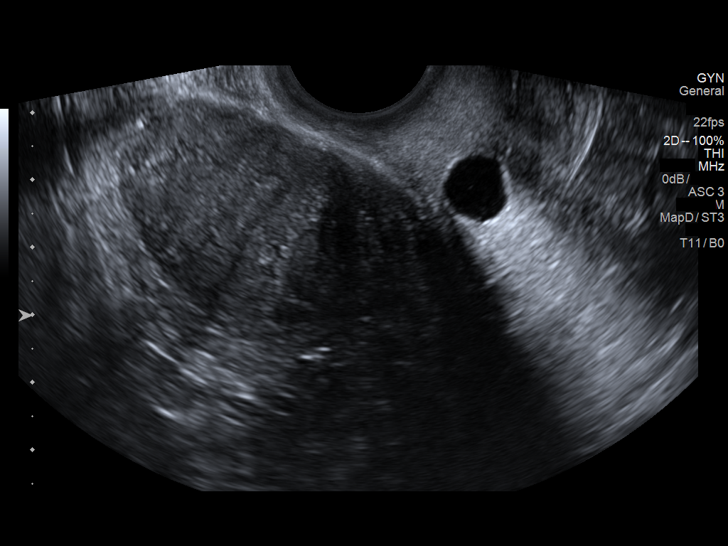
[im 39/72]
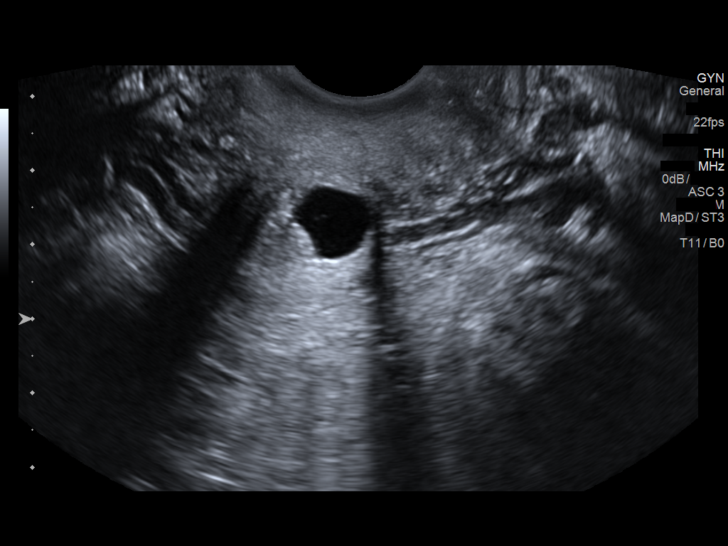
[im 45/72]
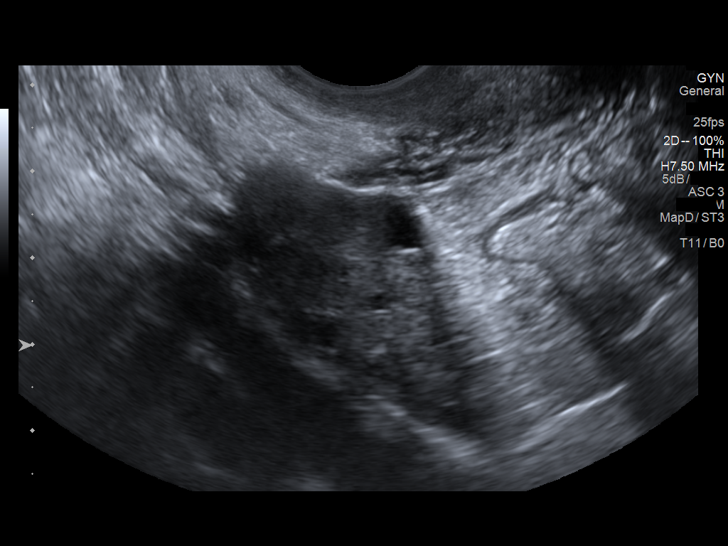
[im 48/72]
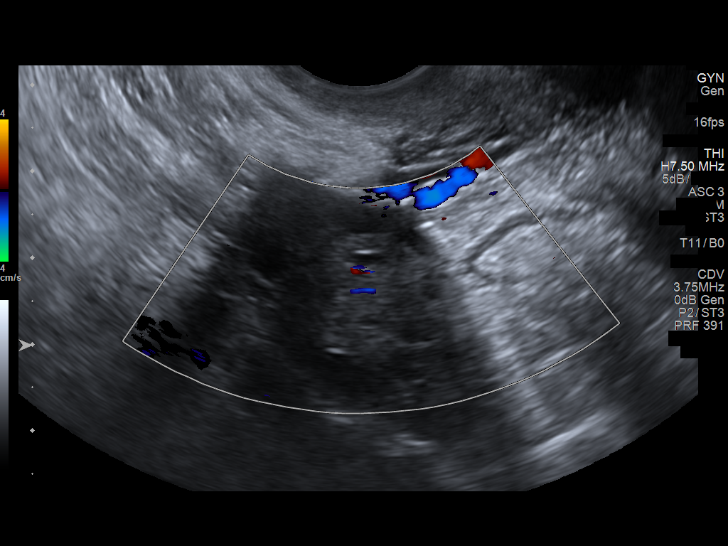
[im 54/72]
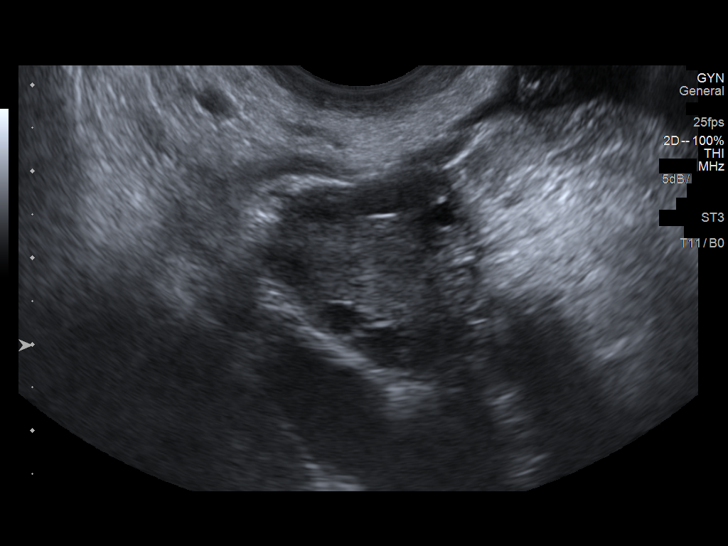
[im 60/72]
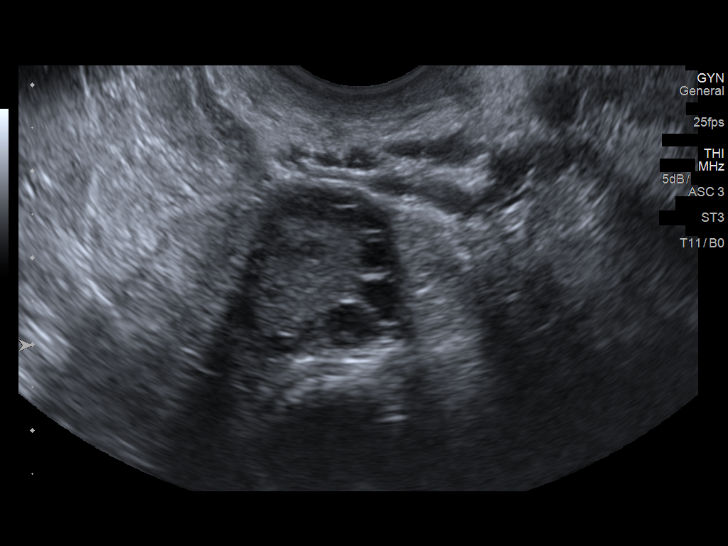
[im 66/72]
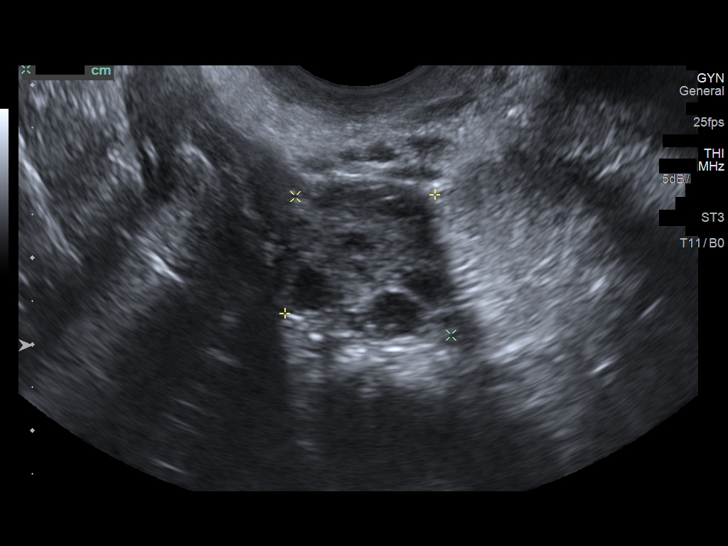
[im 72/72]
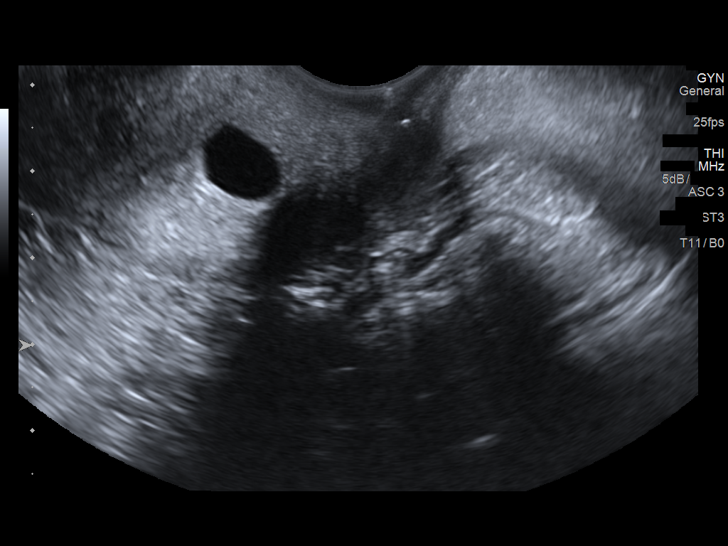

[14 of 25 positions shown; findings below may reference images not displayed]

FINDINGS: Uterus

Measurements: 8 x 4.4 x 5.8 cm. No fibroids or other mass
visualized.

Endometrium

Thickness: 12 mm.  No focal abnormality visualized.

Right ovary

Measurements: 3.9 x 2.3 x 2.4 cm. Normal appearance/no adnexal mass.

Left ovary

Measurements: 2.2 x 2.4 x 2.7 cm. Normal appearance/no adnexal mass.

Pulsed Doppler evaluation of both ovaries demonstrates normal
low-resistance arterial and venous waveforms.

Other findings

No abnormal free fluid.
IMPRESSION: 1. Normal pelvic ultrasound.
2. No evidence of ovarian torsion.

## 2018-12-28 ENCOUNTER — Inpatient Hospital Stay (HOSPITAL_COMMUNITY)
Admission: EM | Admit: 2018-12-28 | Discharge: 2018-12-29 | Disposition: A | Discharge status: Home / Self Care | Payer: Medicaid Other | Attending: Obstetrics and Gynecology | Admitting: Obstetrics and Gynecology

## 2018-12-28 ENCOUNTER — Encounter (HOSPITAL_COMMUNITY): Payer: Self-pay

## 2018-12-28 ENCOUNTER — Other Ambulatory Visit: Payer: Self-pay

## 2018-12-28 DIAGNOSIS — Z87891 Personal history of nicotine dependence: Secondary | ICD-10-CM | POA: Diagnosis not present

## 2018-12-28 DIAGNOSIS — Z3689 Encounter for other specified antenatal screening: Secondary | ICD-10-CM

## 2018-12-28 DIAGNOSIS — Z043 Encounter for examination and observation following other accident: Secondary | ICD-10-CM | POA: Insufficient documentation

## 2018-12-28 DIAGNOSIS — W19XXXA Unspecified fall, initial encounter: Secondary | ICD-10-CM | POA: Insufficient documentation

## 2018-12-28 DIAGNOSIS — Z880 Allergy status to penicillin: Secondary | ICD-10-CM | POA: Insufficient documentation

## 2018-12-28 DIAGNOSIS — J45909 Unspecified asthma, uncomplicated: Secondary | ICD-10-CM | POA: Diagnosis not present

## 2018-12-28 DIAGNOSIS — Z3A39 39 weeks gestation of pregnancy: Secondary | ICD-10-CM | POA: Insufficient documentation

## 2018-12-28 DIAGNOSIS — E282 Polycystic ovarian syndrome: Secondary | ICD-10-CM | POA: Diagnosis not present

## 2018-12-28 DIAGNOSIS — O368130 Decreased fetal movements, third trimester, not applicable or unspecified: Secondary | ICD-10-CM | POA: Diagnosis not present

## 2018-12-28 DIAGNOSIS — Z888 Allergy status to other drugs, medicaments and biological substances status: Secondary | ICD-10-CM | POA: Diagnosis not present

## 2018-12-28 MED ORDER — ACETAMINOPHEN 500 MG PO TABS
1000.0000 mg | ORAL_TABLET | Freq: Once | ORAL | Status: AC
Start: 2018-12-28 — End: 2018-12-28
  Administered 2018-12-28: 1000 mg via ORAL
  Filled 2018-12-28: qty 2

## 2018-12-28 NOTE — ED Notes (Signed)
Pt in restroom 

## 2018-12-28 NOTE — ED Triage Notes (Signed)
Pt arrives POV for eval of lower back pain w/ "pressure between hips" s/p fall landing on buttocks. Pt fell @ approx 1930, reports no fetal movement since, which she states is abnormal. Denies loss of fluids, vag bleeding, contractions. Pt is GCS 15, A&Ox4. FHT 146 on arrival.

## 2018-12-29 ENCOUNTER — Encounter (HOSPITAL_COMMUNITY): Payer: Self-pay | Admitting: *Deleted

## 2018-12-29 ENCOUNTER — Inpatient Hospital Stay (HOSPITAL_BASED_OUTPATIENT_CLINIC_OR_DEPARTMENT_OTHER): Payer: Medicaid Other

## 2018-12-29 DIAGNOSIS — Z87891 Personal history of nicotine dependence: Secondary | ICD-10-CM | POA: Diagnosis not present

## 2018-12-29 DIAGNOSIS — O9A213 Injury, poisoning and certain other consequences of external causes complicating pregnancy, third trimester: Secondary | ICD-10-CM

## 2018-12-29 DIAGNOSIS — Z880 Allergy status to penicillin: Secondary | ICD-10-CM | POA: Diagnosis not present

## 2018-12-29 DIAGNOSIS — Z043 Encounter for examination and observation following other accident: Secondary | ICD-10-CM | POA: Diagnosis not present

## 2018-12-29 DIAGNOSIS — Z3A39 39 weeks gestation of pregnancy: Secondary | ICD-10-CM

## 2018-12-29 DIAGNOSIS — W19XXXA Unspecified fall, initial encounter: Secondary | ICD-10-CM | POA: Diagnosis not present

## 2018-12-29 DIAGNOSIS — O368130 Decreased fetal movements, third trimester, not applicable or unspecified: Secondary | ICD-10-CM

## 2018-12-29 DIAGNOSIS — O99343 Other mental disorders complicating pregnancy, third trimester: Secondary | ICD-10-CM

## 2018-12-29 DIAGNOSIS — Z888 Allergy status to other drugs, medicaments and biological substances status: Secondary | ICD-10-CM | POA: Diagnosis not present

## 2018-12-29 DIAGNOSIS — J45909 Unspecified asthma, uncomplicated: Secondary | ICD-10-CM | POA: Diagnosis not present

## 2018-12-29 DIAGNOSIS — E282 Polycystic ovarian syndrome: Secondary | ICD-10-CM | POA: Diagnosis not present

## 2018-12-29 LAB — URINALYSIS, ROUTINE W REFLEX MICROSCOPIC
Bilirubin Urine: NEGATIVE
Glucose, UA: NEGATIVE mg/dL
Hgb urine dipstick: NEGATIVE
Ketones, ur: NEGATIVE mg/dL
Leukocytes, UA: NEGATIVE
NITRITE: NEGATIVE
Protein, ur: NEGATIVE mg/dL
SPECIFIC GRAVITY, URINE: 1.023 (ref 1.005–1.030)
pH: 6 (ref 5.0–8.0)

## 2018-12-29 NOTE — ED Notes (Signed)
Called [facility] to take pt to Maternity Admitting for Further assessment

## 2018-12-29 NOTE — MAU Note (Signed)
[facility] transferred [facility]. V/SS. Fallen down stairs on her bottom earlier in the evening. [facility].  Pt had reports she had not felt baby move since she fell. Transfered to MAU for BPP.

## 2018-12-29 NOTE — ED Notes (Signed)
[facility]

## 2018-12-29 NOTE — ED Notes (Signed)
Esignature not available. 

## 2018-12-29 NOTE — MAU Provider Note (Signed)
History     CSN: [id number]  Arrival date and time: 12/28/18 2224   First Provider Initiated Contact with Patient 12/29/18 0114      Chief Complaint  Patient presents with  . Fall   HPI Laura Whitaker is a 22 y.o. G8P0070 at 210w1d who [facility] of decreased fetal movement. She fell at 1930 and states she hasn't felt the baby move since then. [facility] [place] [facility] same [facility] and states they do a BPP and send her home. She denies any vaginal bleeding or leaking of fluid. Denies any pain.  OB History    Gravida  8   Para      Term      Preterm      AB  7   Living        SAB  7   TAB      Ectopic      Multiple      Live Births              Past Medical History:  Diagnosis Date  . Asthma   . PCOS (polycystic ovarian syndrome)     Past Surgical History:  Procedure Laterality Date  . NO PAST SURGERIES      Family History  Problem Relation Age of Onset  . Diabetes Mother   . Hypertension Other     Social History   Tobacco Use  . Smoking status: Former Smoker    Types: Cigarettes    Last attempt to quit: 12/31/2017    Years since quitting: 0.9  . Smokeless tobacco: Never Used  Substance Use Topics  . Alcohol use: No  . Drug use: No    Allergies:  Allergies  Allergen Reactions  . Penicillin G Anaphylaxis  . Pineapple Anaphylaxis  . Amoxicillin   . Coconut Flavor   . Coconut Oil Hives  . Penicillins Other (See Comments)    CHILDHOOD  . Prednisone Hives    Medications Prior to Admission  Medication Sig Dispense Refill Last Dose  . sertraline (ZOLOFT) 50 MG tablet Take 50 mg by mouth daily.   12/27/2018 at Unknown time    Review of Systems  Constitutional: Negative.  Negative for fatigue and fever.  HENT: Negative.   Respiratory: Negative.  Negative for shortness of breath.   Cardiovascular: Negative.  Negative for chest pain.  Gastrointestinal: Negative.   Negative for abdominal pain, constipation, diarrhea, nausea and vomiting.  Genitourinary: Negative.  Negative for dysuria, vaginal bleeding and vaginal discharge.  Neurological: Negative.  Negative for dizziness and headaches.   Physical Exam   Blood pressure (!) 116/92, pulse 100, temperature (!) 97.2 F (36.2 C), resp. rate (!) 26, height 5\' 11"  (1.803 m), weight 108.9 kg, SpO2 100 %.  Physical Exam  Nursing note and vitals reviewed. Constitutional: She is oriented to person, place, and time. She appears well-developed and well-nourished. No distress.  HENT:  Head: Normocephalic.  Eyes: Pupils are equal, round, and reactive to light.  Cardiovascular: Normal rate, regular rhythm and normal heart sounds.  Respiratory: Effort normal and breath sounds normal. No respiratory distress.  GI: Soft. Bowel sounds are normal. She exhibits no distension. There is no abdominal tenderness.  Neurological: She is alert and oriented to person, place, and time.  Skin: Skin is warm and dry.  Psychiatric: She has a normal mood and affect. Her behavior is normal. Judgment and thought content  normal.   Fetal Tracing:  Baseline: 130 Variability: moderate Accels: 15x15  Decels: none  Toco: none   MAU Course  Procedures Results for orders placed or performed during the hospital encounter of 12/28/18 (from the past 24 hour(s))  Urinalysis, Routine w reflex microscopic     Status: None   Collection Time: 12/29/18 12:14 AM  Result Value Ref Range   Color, Urine YELLOW YELLOW   APPearance CLEAR CLEAR   Specific Gravity, Urine 1.023 1.005 - 1.030   pH 6.0 5.0 - 8.0   Glucose, UA NEGATIVE NEGATIVE mg/dL   Hgb urine dipstick NEGATIVE NEGATIVE   Bilirubin Urine NEGATIVE NEGATIVE   Ketones, ur NEGATIVE NEGATIVE mg/dL   Protein, ur NEGATIVE NEGATIVE mg/dL   Nitrite NEGATIVE NEGATIVE   Leukocytes, UA NEGATIVE NEGATIVE   MDM Audible fetal movement US MFM BPP- 8/8 with AFI 12cm Patient reports  normal movement since going to ultrasound  [facility] but does not want to go back there. States she [facility] to transfer [facility]. List given of providers and strongly [facility] [place].   Assessment and Plan   1. Decreased fetal movements in third trimester, single or unspecified fetus   2. NST (non-stress test) reactive   3. [redacted] weeks gestation of pregnancy    -Discharge home in stable condition -Fetal kick count precautions discussed -Patient advised to follow-up with OB in [place] until she transfers care -Patient may return to MAU as needed or if her condition were to change or worsen  Rolm BookbinderCaroline M  CNM 12/29/2018, 1:15 AM

## 2018-12-29 NOTE — ED Notes (Signed)
[facility] SPOKE WITH DR KELLY DAVIS AND TOLD OF PT'S SYMPTOMS, HEALTH HISTORY, EFM, NO CONTRACTIONS, NO BLEEDING OR LEAKING OF FLUID, NOT FEELING BABY MOVE; HAS HX OF TX FOR UTI X 3 DURING PREGNANCY; BACK PAIN, PAIN IN BLADDER AREA WHEN URINATES, PAIN IN HIPS; ORDERS FOR U/A, SVE, AND PT TO TRANSFER TO [facility]; [facility] RELAYED INFO TO PROVIDER

## 2018-12-29 NOTE — Progress Notes (Signed)
REPORT GIVEN TO [facility] VIA PHONE

## 2018-12-29 NOTE — ED Provider Notes (Signed)
[facility]OMENS MATERNITY ASSESSMENT UNIT Provider Note   CSN: [id number] Arrival date & time: 12/28/18  2224     History   Chief Complaint Chief Complaint  Patient presents with  . Fall    HPI Fabian NovemberWhitney E Win is a 22 y.o. female.  HPI Patient presents to the emergency department is [redacted] weeks pregnant that had a fall earlier evening.  The patient states she fell landing straight on her buttocks and was having lower back discomfort along with pain in between her hips.  The patient states that she feels like she has not felt the baby move as much.  Patient has no other injuries or complaints.  The patient states she is not had any vaginal bleeding or discharge of any fluids vaginally.  The patient denies chest pain, shortness of breath, headache,blurred vision, neck pain, weakness, numbness, dizziness, anorexia, edema, abdominal pain, nausea, vomiting, hematemesis, near syncope, or syncope. Past Medical History:  Diagnosis Date  . Asthma   . PCOS (polycystic ovarian syndrome)     Patient Active Problem List   Diagnosis Date Noted  . Secondary amenorrhea 12/29/2014    Past Surgical History:  Procedure Laterality Date  . NO PAST SURGERIES       OB History    Gravida  8   Para      Term      Preterm      AB  7   Living        SAB  7   TAB      Ectopic      Multiple      Live Births               Home Medications    Prior to Admission medications   Medication Sig Start Date End Date Taking? Authorizing Provider  sertraline (ZOLOFT) 50 MG tablet Take 50 mg by mouth daily. 12/04/18  Yes [provider]    Family History Family History  Problem Relation Age of Onset  . Diabetes Mother   . Hypertension Other     Social History Social History   Tobacco Use  . Smoking status: Former Smoker    Types: Cigarettes    Last attempt to quit: 12/31/2017    Years since quitting: 0.9  . Smokeless tobacco: Never Used  Substance Use Topics  . Alcohol  use: No  . Drug use: No     Allergies   Penicillin g; Pineapple; Amoxicillin; Coconut flavor; Coconut oil; Penicillins; and Prednisone   Review of Systems Review of Systems All other systems negative except as documented in the HPI. All pertinent positives and negatives as reviewed in the HPI.  Physical Exam Updated Vital Signs BP 124/79   Pulse 85   Temp 98.4 F (36.9 C)   Resp 18   Ht 5\' 11"  (1.803 m)   Wt 108.9 kg   LMP  (LMP Unknown)   SpO2 100%   BMI 33.47 kg/m   Physical Exam Vitals signs and nursing note reviewed.  Constitutional:      General: She is not in acute distress.    Appearance: She is well-developed.  HENT:     Head: Normocephalic and atraumatic.  Eyes:     Pupils: Pupils are equal, round, and reactive to light.  Neck:     Musculoskeletal: Normal range of motion and neck supple.  Cardiovascular:     Rate and Rhythm: Normal rate and regular rhythm.     Heart sounds: Normal heart  sounds. No murmur. No friction rub. No gallop.   Pulmonary:     Effort: Pulmonary effort is normal. No respiratory distress.     Breath sounds: Normal breath sounds. No wheezing.  Abdominal:     General: Bowel sounds are normal. There is no distension.     Palpations: Abdomen is soft.     Tenderness: There is no abdominal tenderness.    Skin:    General: Skin is warm and dry.     Capillary Refill: Capillary refill takes less than 2 seconds.     Findings: No erythema or rash.  Neurological:     Mental Status: She is alert and oriented to person, place, and time.     Motor: No abnormal muscle tone.     Coordination: Coordination normal.  Psychiatric:        Behavior: Behavior normal.      ED Treatments / Results  Labs (all labs ordered are listed, but only abnormal results are displayed) Labs Reviewed  CULTURE, OB URINE  URINALYSIS, ROUTINE W REFLEX MICROSCOPIC    EKG None  Radiology No results found.  Procedures Procedures (including critical care  time)  Medications Ordered in ED Medications  acetaminophen (TYLENOL) tablet 1,000 mg (1,000 mg Oral Given 12/28/18 2308)     Initial Impression / Assessment and Plan / ED Course  I have reviewed the triage vital signs and the nursing notes.  Pertinent labs & imaging results that were available during my care of the patient were reviewed by me and considered in my medical decision making (see chart for details).    Patient was assessed by the rapid response OB nurse and [facility]  The patient has been otherwise stable here in the emergency department.   Final Clinical Impressions(s) / ED Diagnoses   Final diagnoses:  Decreased fetal movements in third trimester, single or unspecified fetus  NST (non-stress test) reactive  [redacted] weeks gestation of pregnancy    ED Discharge Orders    None       Charlestine NightLawyer, Gracielynn Birkel, PA-C 12/29/18 0458    Ward, Layla MawKristen N, DO 12/29/18 0505

## 2018-12-29 NOTE — Progress Notes (Signed)
[facility] GAVE REPORT VIA PHONE TO TRACE[facility], [facility]

## 2018-12-29 NOTE — Discharge Instructions (Signed)
[facility] Prenatal Care Providers °         °[facility]  ° Phone: [phone] ° °[facility] @ [facility]  ° Phone: [phone] ° °[facility]      ° Phone: [phone]   °         °[facility] @ [place]    ° Phone: [phone] °         °[facility] @ [place]  ° Phone: [phone] ° °[facility] @ [facility] ° Phone: [phone] °    °[facility] ° Phone: [phone] °Safe Medications in Pregnancy  ° °Acne: °Benzoyl Peroxide °Salicylic Acid ° °Backache/Headache: °Tylenol: 2 regular strength every 4 hours OR °             2 Extra strength every 6 hours ° °Colds/Coughs/Allergies: °Benadryl (alcohol free) 25 mg every 6 hours as needed °Breath right strips °Claritin °Cepacol throat lozenges °Chloraseptic throat spray °Cold-Eeze- up to three times per day °Cough drops, alcohol free °Flonase (by prescription only) °Guaifenesin °Mucinex °Robitussin DM (plain only, alcohol free) °Saline nasal spray/drops °Sudafed (pseudoephedrine) & Actifed ** use only after [redacted] weeks gestation and if you do not have high blood pressure °Tylenol °Vicks Vaporub °Zinc lozenges °Zyrtec  ° °Constipation: °Colace °Ducolax suppositories °Fleet enema °Glycerin suppositories °Metamucil °Milk of magnesia °Miralax °Senokot °Smooth move tea ° °Diarrhea: °Kaopectate °Imodium A-D ° °*NO pepto Bismol ° °Hemorrhoids: °Anusol °Anusol HC °Preparation H °Tucks ° °Indigestion: °Tums °Maalox °Mylanta °Zantac  °Pepcid ° °Insomnia: °Benadryl (alcohol free) 25mg every 6 hours as needed °Tylenol PM °Unisom, no Gelcaps ° °Leg Cramps: °Tums °MagGel ° °Nausea/Vomiting:  °Bonine °Dramamine °Emetrol °Ginger extract °Sea bands °Meclizine  °Nausea medication to take during pregnancy:  °Unisom (doxylamine succinate 25 mg tablets) Take one tablet daily at bedtime. If symptoms are not adequately controlled, the dose  can be increased to a maximum recommended dose of two tablets daily (1/2 tablet in the morning, 1/2 tablet mid-afternoon and one at bedtime). °Vitamin B6 100mg tablets. Take one tablet twice a day (up to 200 mg per day). ° °Skin Rashes: °Aveeno products °Benadryl cream or 25mg every 6 hours as needed °Calamine Lotion °1% cortisone cream ° °Yeast infection: °Gyne-lotrimin 7 °Monistat 7 ° ° °**If taking multiple medications, please check labels to avoid duplicating the same active ingredients °**take medication as directed on the label °** Do not exceed 4000 mg of tylenol in 24 hours °**Do not take medications that contain aspirin or ibuprofen ° ° ° ° °

## 2018-12-30 LAB — CULTURE, OB URINE: Culture: <10000 — AB

## 2018-12-31 NOTE — L&D Delivery Note (Signed)
Patient: Laura Whitaker MRN: [id number]  GBS status: Negative, IAP given: None   Patient is a 23 y.o. now G8P1 s/p NSVD at [redacted]w[redacted]d, who was admitted for IOL for NRNST. AROM 5h 88m prior to delivery with c;ear fluid.    Delivery Note At 3:31 PM a viable female was delivered via Vaginal, Spontaneous (Presentation: LOA).  APGAR: 8, 9; weight 7 lb 6.9 oz (3371 g).   Placenta status: spontaneous, intact.  Cord: 3 vessel with the following complications: nuchal x1.  Cord pH: N/A  Anesthesia:  Epidural  Episiotomy: None Lacerations: Right Labial Suture Repair: 3.0 vicryl Est. Blood Loss (mL): 75  Head delivered LOA. Nuchal cord present x1, reduced at perineum. Shoulder and body delivered in usual fashion. Infant with spontaneous cry, placed on mother's abdomen, dried and bulb suctioned. Cord clamped x 2 after 1-minute delay, and cut by family member. Cord blood drawn. Placenta delivered spontaneously with gentle cord traction. Fundus firm with massage and Pitocin. Perineum inspected and found to have right and left labial lacerations. Right labial laceration repaired with 3.0 vicryl. Left labial laceration was hemostatic without repair. After delivery mom and infant noted to have fevers. Tylenol 1000 mg given to mom and placenta sent to pathology for suspected Triple I.   Mom to postpartum.  Baby to Couplet care / Skin to Skin.  De Hollingshead 01/06/2019, 6:57 PM

## 2019-01-06 ENCOUNTER — Other Ambulatory Visit: Payer: Self-pay

## 2019-01-06 ENCOUNTER — Encounter (HOSPITAL_COMMUNITY): Payer: Self-pay | Admitting: *Deleted

## 2019-01-06 ENCOUNTER — Inpatient Hospital Stay (HOSPITAL_COMMUNITY): Payer: Medicaid Other | Admitting: Anesthesiology

## 2019-01-06 ENCOUNTER — Inpatient Hospital Stay (HOSPITAL_COMMUNITY)
Admission: AD | Admit: 2019-01-06 | Discharge: 2019-01-08 | DRG: 805 | Disposition: A | Discharge status: Home / Self Care | Payer: Medicaid Other | Attending: Obstetrics & Gynecology | Admitting: Obstetrics & Gynecology

## 2019-01-06 DIAGNOSIS — O99344 Other mental disorders complicating childbirth: Secondary | ICD-10-CM | POA: Diagnosis present

## 2019-01-06 DIAGNOSIS — Z3A40 40 weeks gestation of pregnancy: Secondary | ICD-10-CM

## 2019-01-06 DIAGNOSIS — O99324 Drug use complicating childbirth: Secondary | ICD-10-CM | POA: Diagnosis present

## 2019-01-06 DIAGNOSIS — O480 Post-term pregnancy: Secondary | ICD-10-CM | POA: Diagnosis not present

## 2019-01-06 DIAGNOSIS — F1290 Cannabis use, unspecified, uncomplicated: Secondary | ICD-10-CM | POA: Diagnosis present

## 2019-01-06 DIAGNOSIS — O411230 Chorioamnionitis, third trimester, not applicable or unspecified: Secondary | ICD-10-CM | POA: Diagnosis present

## 2019-01-06 DIAGNOSIS — F32A Depression, unspecified: Secondary | ICD-10-CM | POA: Diagnosis present

## 2019-01-06 DIAGNOSIS — F329 Major depressive disorder, single episode, unspecified: Secondary | ICD-10-CM | POA: Diagnosis present

## 2019-01-06 DIAGNOSIS — F419 Anxiety disorder, unspecified: Secondary | ICD-10-CM | POA: Diagnosis present

## 2019-01-06 DIAGNOSIS — O99340 Other mental disorders complicating pregnancy, unspecified trimester: Secondary | ICD-10-CM

## 2019-01-06 DIAGNOSIS — Z87891 Personal history of nicotine dependence: Secondary | ICD-10-CM

## 2019-01-06 HISTORY — DX: Urinary tract infection, site not specified: N39.0

## 2019-01-06 HISTORY — DX: Major depressive disorder, single episode, unspecified: F32.9

## 2019-01-06 HISTORY — DX: Anxiety disorder, unspecified: F41.9

## 2019-01-06 HISTORY — DX: Depression, unspecified: F32.A

## 2019-01-06 LAB — CBC
HCT: 39.5 % (ref 36.0–46.0)
Hemoglobin: 13.2 g/dL (ref 12.0–15.0)
MCH: 30.1 pg (ref 26.0–34.0)
MCHC: 33.4 g/dL (ref 30.0–36.0)
MCV: 90.2 fL (ref 80.0–100.0)
Platelets: 287 10*3/uL (ref 150–400)
RBC: 4.38 MIL/uL (ref 3.87–5.11)
RDW: 13.5 % (ref 11.5–15.5)
WBC: 16.9 10*3/uL — ABNORMAL HIGH (ref 4.0–10.5)
nRBC: 0 % (ref 0.0–0.2)

## 2019-01-06 LAB — RAPID URINE DRUG SCREEN, HOSP PERFORMED
AMPHETAMINES: NOT DETECTED
BENZODIAZEPINES: NOT DETECTED
Barbiturates: NOT DETECTED
Cocaine: NOT DETECTED
Opiates: NOT DETECTED
Tetrahydrocannabinol: POSITIVE — AB

## 2019-01-06 LAB — OB RESULTS CONSOLE GBS: GBS: NEGATIVE

## 2019-01-06 LAB — TYPE AND SCREEN
ABO/RH(D): A POS
ANTIBODY SCREEN: NEGATIVE

## 2019-01-06 LAB — GROUP B STREP BY PCR: Group B strep by PCR: NEGATIVE

## 2019-01-06 LAB — RPR: RPR Ser Ql: NONREACTIVE

## 2019-01-06 LAB — ABO/RH: ABO/RH(D): A POS

## 2019-01-06 MED ORDER — OXYCODONE-ACETAMINOPHEN 5-325 MG PO TABS: 1.0000 | ORAL_TABLET | ORAL | Status: DC | PRN

## 2019-01-06 MED ORDER — OXYTOCIN 40 UNITS IN NORMAL SALINE INFUSION - SIMPLE MED
2.5000 [IU]/h | INTRAVENOUS | Status: DC
  Administered 2019-01-06: 2.5 [IU]/h via INTRAVENOUS
  Filled 2019-01-06 (×2): qty 1000

## 2019-01-06 MED ORDER — OXYTOCIN BOLUS FROM INFUSION: 500.0000 mL | Freq: Once | INTRAVENOUS | Status: DC

## 2019-01-06 MED ORDER — TERBUTALINE SULFATE 1 MG/ML IJ SOLN
0.2500 mg | Freq: Once | INTRAMUSCULAR | Status: DC | PRN
  Filled 2019-01-06: qty 1

## 2019-01-06 MED ORDER — MISOPROSTOL 25 MCG QUARTER TABLET
25.0000 ug | ORAL_TABLET | ORAL | Status: DC | PRN
  Administered 2019-01-06: 25 ug via VAGINAL
  Filled 2019-01-06 (×2): qty 1

## 2019-01-06 MED ORDER — EPHEDRINE 5 MG/ML INJ
10.0000 mg | INTRAVENOUS | Status: DC | PRN
  Filled 2019-01-06: qty 2

## 2019-01-06 MED ORDER — WITCH HAZEL-GLYCERIN EX PADS: 1.0000 "application " | MEDICATED_PAD | CUTANEOUS | Status: DC | PRN

## 2019-01-06 MED ORDER — SERTRALINE HCL 50 MG PO TABS
50.0000 mg | ORAL_TABLET | Freq: Every day | ORAL | Status: DC
  Administered 2019-01-07 (×2): 50 mg via ORAL
  Filled 2019-01-06 (×2): qty 1

## 2019-01-06 MED ORDER — ZOLPIDEM TARTRATE 5 MG PO TABS: 5.0000 mg | ORAL_TABLET | Freq: Every evening | ORAL | Status: DC | PRN

## 2019-01-06 MED ORDER — ACETAMINOPHEN 325 MG PO TABS: 650.0000 mg | ORAL_TABLET | ORAL | Status: DC | PRN

## 2019-01-06 MED ORDER — SERTRALINE HCL 50 MG PO TABS
50.0000 mg | ORAL_TABLET | Freq: Every day | ORAL | Status: DC
  Filled 2019-01-06 (×2): qty 1

## 2019-01-06 MED ORDER — LACTATED RINGERS IV SOLN
INTRAVENOUS | Status: DC
  Administered 2019-01-06 (×2): via INTRAVENOUS

## 2019-01-06 MED ORDER — OXYTOCIN 40 UNITS IN NORMAL SALINE INFUSION - SIMPLE MED
1.0000 m[IU]/min | INTRAVENOUS | Status: DC
  Administered 2019-01-06: 2 m[IU]/min via INTRAVENOUS

## 2019-01-06 MED ORDER — ACETAMINOPHEN 500 MG PO TABS
1000.0000 mg | ORAL_TABLET | Freq: Once | ORAL | Status: AC
  Administered 2019-01-06: 1000 mg via ORAL
  Filled 2019-01-06: qty 2

## 2019-01-06 MED ORDER — LIDOCAINE HCL (PF) 1 % IJ SOLN
INTRAMUSCULAR | Status: DC | PRN
  Administered 2019-01-06: 3 mL via EPIDURAL
  Administered 2019-01-06: 4 mL via EPIDURAL
  Administered 2019-01-06: 5 mL via EPIDURAL

## 2019-01-06 MED ORDER — MEASLES, MUMPS & RUBELLA VAC IJ SOLR: 0.5000 mL | Freq: Once | INTRAMUSCULAR | Status: DC

## 2019-01-06 MED ORDER — DIPHENHYDRAMINE HCL 50 MG/ML IJ SOLN: 12.5000 mg | INTRAMUSCULAR | Status: DC | PRN

## 2019-01-06 MED ORDER — DIPHENHYDRAMINE HCL 25 MG PO CAPS: 25.0000 mg | ORAL_CAPSULE | Freq: Four times a day (QID) | ORAL | Status: DC | PRN

## 2019-01-06 MED ORDER — SOD CITRATE-CITRIC ACID 500-334 MG/5ML PO SOLN: 30.0000 mL | ORAL | Status: DC | PRN

## 2019-01-06 MED ORDER — DIBUCAINE 1 % RE OINT: 1.0000 "application " | TOPICAL_OINTMENT | RECTAL | Status: DC | PRN

## 2019-01-06 MED ORDER — OXYCODONE-ACETAMINOPHEN 5-325 MG PO TABS: 2.0000 | ORAL_TABLET | ORAL | Status: DC | PRN

## 2019-01-06 MED ORDER — ONDANSETRON HCL 4 MG PO TABS: 4.0000 mg | ORAL_TABLET | ORAL | Status: DC | PRN

## 2019-01-06 MED ORDER — LIDOCAINE HCL (PF) 1 % IJ SOLN
30.0000 mL | INTRAMUSCULAR | Status: DC | PRN
  Filled 2019-01-06: qty 30

## 2019-01-06 MED ORDER — OXYTOCIN 40 UNITS IN LACTATED RINGERS INFUSION - SIMPLE MED: 2.5000 [IU]/h | INTRAVENOUS | Status: DC

## 2019-01-06 MED ORDER — OXYTOCIN BOLUS FROM INFUSION
500.0000 mL | Freq: Once | INTRAVENOUS | Status: AC
  Administered 2019-01-06: 500 mL via INTRAVENOUS

## 2019-01-06 MED ORDER — PHENYLEPHRINE 40 MCG/ML (10ML) SYRINGE FOR IV PUSH (FOR BLOOD PRESSURE SUPPORT)
80.0000 ug | PREFILLED_SYRINGE | INTRAVENOUS | Status: DC | PRN
  Filled 2019-01-06: qty 10

## 2019-01-06 MED ORDER — IBUPROFEN 600 MG PO TABS
600.0000 mg | ORAL_TABLET | Freq: Four times a day (QID) | ORAL | Status: DC
  Administered 2019-01-07 – 2019-01-08 (×6): 600 mg via ORAL
  Filled 2019-01-06 (×7): qty 1

## 2019-01-06 MED ORDER — LACTATED RINGERS IV SOLN: 500.0000 mL | INTRAVENOUS | Status: DC | PRN

## 2019-01-06 MED ORDER — LACTATED RINGERS IV SOLN: 500.0000 mL | Freq: Once | INTRAVENOUS | Status: DC

## 2019-01-06 MED ORDER — ONDANSETRON HCL 4 MG/2ML IJ SOLN
4.0000 mg | Freq: Four times a day (QID) | INTRAMUSCULAR | Status: DC | PRN
  Administered 2019-01-06: 4 mg via INTRAVENOUS
  Filled 2019-01-06: qty 2

## 2019-01-06 MED ORDER — PHENYLEPHRINE 40 MCG/ML (10ML) SYRINGE FOR IV PUSH (FOR BLOOD PRESSURE SUPPORT)
80.0000 ug | PREFILLED_SYRINGE | INTRAVENOUS | Status: DC | PRN
  Filled 2019-01-06 (×2): qty 10

## 2019-01-06 MED ORDER — LACTATED RINGERS IV SOLN
500.0000 mL | Freq: Once | INTRAVENOUS | Status: AC
  Administered 2019-01-06: 500 mL via INTRAVENOUS

## 2019-01-06 MED ORDER — TETANUS-DIPHTH-ACELL PERTUSSIS 5-2.5-18.5 LF-MCG/0.5 IM SUSP: 0.5000 mL | Freq: Once | INTRAMUSCULAR | Status: DC

## 2019-01-06 MED ORDER — SIMETHICONE 80 MG PO CHEW: 80.0000 mg | CHEWABLE_TABLET | ORAL | Status: DC | PRN

## 2019-01-06 MED ORDER — COCONUT OIL OIL: 1.0000 "application " | TOPICAL_OIL | Status: DC | PRN

## 2019-01-06 MED ORDER — FENTANYL 2.5 MCG/ML BUPIVACAINE 1/10 % EPIDURAL INFUSION (WH - ANES)
14.0000 mL/h | INTRAMUSCULAR | Status: DC | PRN
  Administered 2019-01-06: 14 mL/h via EPIDURAL
  Filled 2019-01-06: qty 100

## 2019-01-06 MED ORDER — ONDANSETRON HCL 4 MG/2ML IJ SOLN: 4.0000 mg | INTRAMUSCULAR | Status: DC | PRN

## 2019-01-06 MED ORDER — SENNOSIDES-DOCUSATE SODIUM 8.6-50 MG PO TABS
2.0000 | ORAL_TABLET | ORAL | Status: DC
  Administered 2019-01-07 – 2019-01-08 (×2): 2 via ORAL
  Filled 2019-01-06 (×2): qty 2

## 2019-01-06 MED ORDER — PRENATAL MULTIVITAMIN CH
1.0000 | ORAL_TABLET | Freq: Every day | ORAL | Status: DC
  Administered 2019-01-07: 1 via ORAL
  Filled 2019-01-06: qty 1

## 2019-01-06 MED ORDER — BENZOCAINE-MENTHOL 20-0.5 % EX AERO
1.0000 "application " | INHALATION_SPRAY | CUTANEOUS | Status: DC | PRN
  Administered 2019-01-06: 1 via TOPICAL
  Filled 2019-01-06: qty 56

## 2019-01-06 NOTE — Progress Notes (Signed)
LABOR PROGRESS NOTE  Laura Whitaker is a 23 y.o. G8P0070 at [redacted]w[redacted]d  admitted for IOL for NRNST (variables at term).   Subjective: Resting comfortably, sleeping.  Objective: BP 106/65   Pulse 77   Temp 98.8 F (37.1 C) (Oral)   Resp 18   Ht 5\' 11"  (1.803 m)   Wt 109.3 kg   LMP  (LMP Unknown)   SpO2 98%   BMI 33.61 kg/m  or  Vitals:   01/06/19 1300 01/06/19 1301 01/06/19 1331 01/06/19 1401  BP:  127/88 119/76 106/65  Pulse:   86 77  Resp:      Temp: 98.8 F (37.1 C)     TempSrc: Oral     SpO2:      Weight:      Height:        Dilation: 5 Effacement (%): 80 Station: -1 Presentation: Vertex Exam by:: Sandy Salaam, RN FHT: baseline rate 135, moderate varibility, 15x15 acel, variable decels Toco: IUPC in place  Labs: Lab Results  Component Value Date   WBC 16.9 (H) 01/06/2019   HGB 13.2 01/06/2019   HCT 39.5 01/06/2019   MCV 90.2 01/06/2019   PLT 287 01/06/2019    Patient Active Problem List   Diagnosis Date Noted  . Normal labor and delivery 01/06/2019  . Marijuana use 01/06/2019  . Depression affecting pregnancy 09/17/2018    Assessment / Plan: 23 y.o. G8P0070 at [redacted]w[redacted]d here for IOL for NRNST.  Labor: AROM. S/p foley bulb and cyto x 1, pitocin @ 1024.  Fetal Wellbeing: Variable decels, 15x15 acels, baseline HR 135 Pain Control:  Epidural  Anticipated MOD:  SVD  Trenda Moots MS3 OB Fellow  01/06/2019, 2:36 PM

## 2019-01-06 NOTE — Anesthesia Pain Management Evaluation Note (Signed)
  [facility] Pain Management Visit Note  Patient: Laura Whitaker, 23 y.o., female  "Hello I am a member of the anesthesia team at [facility]. We have an anesthesia team available at all times to provide care throughout the hospital, including epidural management and anesthesia for C-section. I don't know your plan for the delivery whether it a natural birth, water birth, IV sedation, nitrous supplementation, doula or epidural, but we want to meet your pain goals."   1.Was your pain managed to your expectations on prior hospitalizations?   No prior hospitalizations  2.What is your expectation for pain management during this hospitalization?     Labor support without medications, Epidural, IV pain meds and Nitrous Oxide  3.How can we help you reach that goal? Pt unsure which method of pain control she desires. All methods were discussed, questions answered.  Record the patient's initial score and the patient's pain goal.   Pain: 7  Pain Goal: 8 [facility] wants you to be able to say your pain was always managed very well.  Minnie Legros 01/06/2019

## 2019-01-06 NOTE — Anesthesia Procedure Notes (Signed)
Epidural Patient location during procedure: OB Start time: 01/06/2019 10:34 AM End time: 01/06/2019 10:40 AM  Staffing Anesthesiologist: Cecile Hearing, MD Performed: anesthesiologist   Preanesthetic Checklist Completed: patient identified, pre-op evaluation, timeout performed, IV checked, risks and benefits discussed and monitors and equipment checked  Epidural Patient position: sitting Prep: DuraPrep Patient monitoring: blood pressure and continuous pulse ox Approach: midline Location: L3-L4 Injection technique: LOR air  Needle:  Needle type: Tuohy  Needle gauge: 17 G Needle length: 9 cm Needle insertion depth: 6 cm Catheter size: 19 Gauge Catheter at skin depth: 11 cm Test dose: negative and Other (1% Lidocaine)  Additional Notes Patient identified.  Risk benefits discussed including failed block, incomplete pain control, headache, nerve damage, paralysis, blood pressure changes, nausea, vomiting, reactions to medication both toxic or allergic, and postpartum back pain.  Patient expressed understanding and wished to proceed.  All questions were answered.  Sterile technique used throughout procedure and epidural site dressed with sterile barrier dressing. No paresthesia or other complications noted. The patient did not experience any signs of intravascular injection such as tinnitus or metallic taste in mouth nor signs of intrathecal spread such as rapid motor block. Please see nursing notes for vital signs. Reason for block:procedure for pain

## 2019-01-06 NOTE — MAU Note (Signed)
Pt presents to MAU c/o vaginal bleeding that started about 0015 when she wiped. Pt states for the last few days she has noticed a clear mucous coming out pt states it is not watery. Pt reports NO fetal movement since before the sun went down. Pt reports lower abdominal pain that goes to her back and thighs. Pt states feels like a [place] kicked her.

## 2019-01-06 NOTE — Progress Notes (Signed)
LABOR PROGRESS NOTE  ELENAMARIE FASCHING is a 23 y.o. G8P0070 at [redacted]w[redacted]d  admitted for NRNST (variables at term).   Subjective: Patient crying, uncomfortable with contractions.   Objective: BP 132/78   Pulse 76   Temp 98.9 F (37.2 C) (Oral)   Resp 18   Ht 5\' 11"  (1.803 m)   Wt 109.3 kg   LMP  (LMP Unknown)   BMI 33.61 kg/m  or  Vitals:   01/06/19 0605 01/06/19 0700 01/06/19 0803 01/06/19 0836  BP: 130/85 123/64 132/78   Pulse: 88 79 76   Resp: 16 15 20 18   Temp: 98.6 F (37 C)   98.9 F (37.2 C)  TempSrc: Oral   Oral  Weight:      Height:        Dilation: 5 Effacement (%): 80 Station: -1 Presentation: Vertex Exam by:: Dr. Earlene Plater FHT: baseline rate 130, moderate varibility, +acel, no decel Toco: Difficult to trace on toco, IUPC now in place   Labs: Lab Results  Component Value Date   WBC 16.9 (H) 01/06/2019   HGB 13.2 01/06/2019   HCT 39.5 01/06/2019   MCV 90.2 01/06/2019   PLT 287 01/06/2019    Patient Active Problem List   Diagnosis Date Noted  . Normal labor and delivery 01/06/2019  . Marijuana use 01/06/2019  . Depression affecting pregnancy 09/17/2018    Assessment / Plan: 23 y.o. G8P0070 at [redacted]w[redacted]d here for IOL for NRNST.   Labor: Patient now s/p FB and cyto x1 with good cervical change. AROM at 0950 with clear fluid. Unable to trace contractions on toco so IUPC in place. Will monitor MVUs and assess if need to start Pitocin.  Fetal Wellbeing:  Cat I  Pain Control:  Planning for natural labor but now considering epidural.  Anticipated MOD:  NSVD   Marcy Siren, D.O. OB Fellow  01/06/2019, 9:58 AM
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
## 2019-01-06 NOTE — H&P (Addendum)
LABOR AND DELIVERY ADMISSION HISTORY AND PHYSICAL NOTE  Fabian NovemberWhitney E Aispuro is a 23 y.o. female 108P0070 with IUP at 602w2d by US presenting for Non-reactive NST (variables at term). She reports positive fetal movement. She denies leakage of fluid or vaginal bleeding.  Prenatal History/Complications: [facility] - records in care everywhere Pregnancy complications:  - recurrent variable decels in third trimester - +THC on UDS - UTI in first trimester - history of anxiety/depression - recent admission for SI in 3-4 months ago   Past Medical History: Past Medical History:  Diagnosis Date  . Anxiety   . Asthma   . Depression   . PCOS (polycystic ovarian syndrome)   . UTI (urinary tract infection)     Past Surgical History: Past Surgical History:  Procedure Laterality Date  . NO PAST SURGERIES      Obstetrical History: OB History    Gravida  8   Para      Term      Preterm      AB  7   Living        SAB  7   TAB      Ectopic      Multiple      Live Births              Social History: Social History   Socioeconomic History  . Marital status: Single    Spouse name: Not on file  . Number of children: Not on file  . Years of education: Not on file  . Highest education level: Not on file  Occupational History  . Not on file  Social Needs  . Financial resource strain: Not on file  . Food insecurity:    Worry: Not on file    Inability: Not on file  . Transportation needs:    Medical: Not on file    Non-medical: Not on file  Tobacco Use  . Smoking status: Former Smoker    Types: Cigarettes    Last attempt to quit: 12/31/2017    Years since quitting: 1.0  . Smokeless tobacco: Never Used  Substance and Sexual Activity  . Alcohol use: No  . Drug use: No  . Sexual activity: Yes    Comment: last sex 22 Dec 2018  Lifestyle  . Physical activity:    Days per week: Not on file    Minutes per session: Not on file  . Stress: Not  on file  Relationships  . Social connections:    Talks on phone: Not on file    Gets together: Not on file    Attends religious service: Not on file    Active member of club or organization: Not on file    Attends meetings of clubs or organizations: Not on file    Relationship status: Not on file  Other Topics Concern  . Not on file  Social History Narrative  . Not on file    Family History: Family History  Problem Relation Age of Onset  . Diabetes Mother   . Hypertension Other     Allergies: Allergies  Allergen Reactions  . Penicillin G Anaphylaxis  . Pineapple Anaphylaxis  . Amoxicillin   . Coconut Flavor   . Coconut Oil Hives  . Penicillins Other (See Comments)    CHILDHOOD  . Prednisone Hives    Medications Prior to Admission  Medication Sig Dispense Refill Last Dose  . sertraline (ZOLOFT) 50 MG tablet Take 50  mg by mouth daily.   Past Week at Unknown time     Review of Systems  All systems reviewed and negative except as stated in HPI  Physical Exam Blood pressure 116/77, pulse (!) 102, temperature 98.3 F (36.8 C), temperature source Oral, resp. rate 16, height 5\' 11"  (1.803 m), weight 109.3 kg. General appearance: alert, oriented, NAD Lungs: normal respiratory effort Heart: regular rate Abdomen: soft, non-tender; gravid, FH appropriate for GA Extremities: No calf swelling or tenderness Presentation: cephalic Fetal monitoring: cat 2 - variables  Uterine activity: irregular contractions every 6-10 minutes    Prenatal labs: ABO, Rh:   A Pos Antibody:   neg Rubella:   immunized RPR:   neg HBsAg:   neg HIV:    neg GC/Chlamydia: neg GBS:   unk 1-hr GTT: 138 Genetic screening:  negative Anatomy US: normal  Prenatal Transfer Tool  Maternal Diabetes: No Genetic Screening: Normal Maternal Ultrasounds/Referrals: Normal Fetal Ultrasounds or other Referrals:  None Maternal Substance Abuse:  Yes:  Type: Marijuana Significant Maternal Medications:   Meds include: Zoloft Significant Maternal Lab Results: None  No results found for this or any previous visit (from the past 24 hour(s)).  Patient Active Problem List   Diagnosis Date Noted  . Secondary amenorrhea 12/29/2014    Assessment: Fabian NovemberWhitney E Kellison is a 23 y.o. G8P0070 at 4524w2d here for IOL for non-reactive NST. Patient received most of her care at [place] [facility] but stopped going because she "no longer trusted them". Has PMH significant for significant depression and anxiety, takes zoloft. Had a psych admission back in 08/2018 for SI. GBS unknown, rapid swab performed. Otherwise all workup without abnormality except for a + THC on first trimester screen.  #Labor: cytotec, foley balloon #Pain: IV pain meds #FWB: Cat 2 #ID:  GBS unknown #MOF: breast #MOC:Undecided #Circ:  N/A  Myrene BuddyJacob Fletcher MD PGY-2 Family Medicine Resident 01/06/2019, 2:10 AM   Attestation: I have seen this patient and agree with the resident's documentation. I have examined them separately, and we have discussed the plan of care. I placed FB and vaginal cytotec.   Cristal DeerLaurel S. Earlene PlaterWallace, DO OB/GYN Fellow 4:24 AM

## 2019-01-06 NOTE — Anesthesia Preprocedure Evaluation (Addendum)
Anesthesia Evaluation  Patient identified by MRN, date of birth, ID band Patient awake    Reviewed: Allergy & Precautions, NPO status , Patient's Chart, lab work & pertinent test results  Airway Mallampati: II  TM Distance: >3 FB Neck ROM: Full    Dental  (+) Teeth Intact, Dental Advisory Given, Chipped,    Pulmonary asthma , former smoker,    Pulmonary exam normal breath sounds clear to auscultation       Cardiovascular negative cardio ROS Normal cardiovascular exam Rhythm:Regular Rate:Normal     Neuro/Psych PSYCHIATRIC DISORDERS Anxiety Depression negative neurological ROS     GI/Hepatic negative GI ROS, Neg liver ROS,   Endo/Other  Obesity   Renal/GU negative Renal ROS     Musculoskeletal negative musculoskeletal ROS (+)   Abdominal   Peds  Hematology negative hematology ROS (+) Plt 287k   Anesthesia Other Findings Day of surgery medications reviewed with the patient.  Reproductive/Obstetrics (+) Pregnancy                            Anesthesia Physical Anesthesia Plan  ASA: II  Anesthesia Plan: Epidural   Post-op Pain Management:    Induction:   PONV Risk Score and Plan: 2 and Treatment may vary due to age or medical condition  Airway Management Planned: Natural Airway  Additional Equipment:   Intra-op Plan:   Post-operative Plan:   Informed Consent: I have reviewed the patients History and Physical, chart, labs and discussed the procedure including the risks, benefits and alternatives for the proposed anesthesia with the patient or authorized representative who has indicated his/her understanding and acceptance.   Dental advisory given  Plan Discussed with:   Anesthesia Plan Comments: (Patient identified. Risks/Benefits/Options discussed with patient including but not limited to bleeding, infection, nerve damage, paralysis, failed block, incomplete pain control,  headache, blood pressure changes, nausea, vomiting, reactions to medication both or allergic, itching and postpartum back pain. Confirmed with bedside nurse the patient's most recent platelet count. Confirmed with patient that they are not currently taking any anticoagulation, have any bleeding history or any family history of bleeding disorders. Patient expressed understanding and wished to proceed. All questions were answered. )        Anesthesia Quick Evaluation

## 2019-01-07 NOTE — Progress Notes (Signed)
POSTPARTUM PROGRESS NOTE  Post Partum Day 1  Subjective:  Laura Whitaker is a 23 y.o. [phone] s/p SVD at [redacted]w[redacted]d.  She reports she is doing well. No acute events overnight. She denies any problems with ambulating, voiding or po intake. Denies nausea or vomiting.  Pain is well controlled.  Lochia is decreased, mild.  Objective: Blood pressure 108/72, pulse 69, temperature 98 F (36.7 C), temperature source Oral, resp. rate 16, height 5\' 11"  (1.803 m), weight 109.3 kg, SpO2 99 %, unknown if currently breastfeeding.  Physical Exam:  General: alert, cooperative and no distress Chest: no respiratory distress Heart:regular rate Abdomen: soft, nontender Uterine Fundus: firm, appropriately tender DVT Evaluation: No calf swelling or tenderness Extremities: No edema Skin: warm, dry  Recent Labs    01/06/19 0326  HGB 13.2  HCT 39.5    Assessment/Plan: Laura Whitaker is a 23 y.o. [id number] s/p SVD at [redacted]w[redacted]d   PPD# 1 - Doing well Routine postpartum care Fever during labor: Had temp 102.2 after labor but has been afebrile since, vitals stable, and feeling well. Low concern for ongoing infection, will continue to monitor.  Contraception: Not sure, abstinence until she decides. Pt wants to think about it and discuss more at follow-up OB appointment. Feeding: Breast SW: Social work to see today for positive THC Dispo: Plan for discharge tomorrow. She desires a follow-up appointment at [facility], does not want to return to [facility].   LOS: 1 day    Trenda Moots MS3 01/07/2019, 7:37 AM

## 2019-01-07 NOTE — Clinical Social Work Maternal (Addendum)
CLINICAL SOCIAL WORK MATERNAL/CHILD NOTE  Patient Details  Name: Laura Whitaker MRN: [id number] Date of Birth: April 09, 1996  Date:  01/07/2019  Clinical Social Worker Initiating Note:  Laurey Arrow Date/Time: Initiated:  01/07/19/1300     Child's Name:  Laura Whitaker   Biological Parents:  Mother(FOB declined to provide any information about FOB and reported that FOB will not be involved. )   Need for Interpreter:  None   Reason for Referral:  Behavioral Health Concerns, Current Substance Use/Substance Use During Pregnancy (MOB has hx of marjiuana use and anxiety/depression. )   Address:  [address] [place] [address]    Phone number:  [phone] (home)     Additional phone number:  Household Members/Support Persons (HM/SP):   (Per MOB, MOB resides with MOB's mother, grandmother, sister, and brother. )   HM/SP Name Relationship DOB or Age  HM/SP -1        HM/SP -2        HM/SP -3        HM/SP -4        HM/SP -5        HM/SP -6        HM/SP -7        HM/SP -8          Natural Supports (not living in the home):  Immediate Family, Extended Family, Friends   Chiropodist: None   Employment: Unemployed   Type of Work:     Education:  Programmer, systems   Homebound arranged:    Museum/gallery curator Resources:  Medicaid   Other Resources:  Physicist, medical , [place] Considerations Which May Impact Care:  None Reported  Strengths:  Ability to meet basic needs , Home prepared for child , Psychotropic Medications, Understanding of illness, Pediatrician chosen   Psychotropic Medications:  Zoloft      Pediatrician:    [place] (including Technical sales engineer and surronding areas)  Pediatrician List:   [place]      Pediatrician Fax Number:    Risk Factors/Current Problems:  Substance Use    Cognitive State:   Able to Concentrate , Alert , Insightful , Linear Thinking , Goal Oriented    Mood/Affect:  Happy , Interested , Relaxed , Comfortable , Bright    CSW Assessment: CSW met with MOB in the first floor room/134 to offer support and complete assessment due to hx of Anxiety/depression and marijuana use.  When CSW arrived MOB was bonding with infant as evidence by engaging in skin to skin.  MOB's grandmother and sister was present and with MOB's permission, CSW asked them to leave the room in order to meet with MOB in private. MOB was polite, forthcoming, and receptive to meeting with CSW. MOB expressed that infant is MOB's first baby and it was evident by MOB's facial expression that MOB was excited.  MOB reported having everything needed for baby and having a good support system.  SIDS education was provided, and MOB responded appropriately to CSW's questions.    CSW asked about MOB's MH hx and MOB acknowledged being dx with anxiety/depression and has been taken Zoloft for about 4 months.  CSW assessed for safety and MOB denied SI, HI, and DV. MOB reported feeling like MOB's medication is managing MOB's anxiety/depression symptoms. CSW provided education regarding  the baby blues period vs. perinatal mood disorders, discussed treatment and Whitaker resources for mental health follow up if concerns arise.  CSW recommends self-evaluation during the postpartum time period using the New Mom Checklist from Postpartum Progress and encouraged MOB to contact a medical professional if symptoms are noted at any time.    CSW inquired about marijuana use. MOB openly shared that MOB smoked marijuana during her 1st and 2nd trimester to assist with coping with her anxiety and depression.  MOB reported her last use was about 4 months ago, however, she has occasionally utilized CBD's. CSW informed MOB of hospital's substance exposed policy and MOB was understanding. MOB denied the use of all other illicit substance and  denied having any questions or concerns.  CSW will monitor CDS and will make are report to [place] CPS if warranted. MOB denied resources to outpatient counseling for MH and substance treatment.   CSW Plan/Description:  No Further Intervention Required/No Barriers to Discharge, Sudden Infant Death Syndrome (SIDS) Education, Perinatal Mood and Anxiety Disorder (PMADs) Education, Other Patient/Family Education, Hospital Drug Screen Policy Information, Other Information/Referral to Community Resources, CSW Will Continue to Monitor Umbilical Cord Tissue Drug Screen Results and Make Report if Warranted   Angel Boyd-Gilyard, MSW, LCSW Clinical Social Work [phone]   ANGEL D BOYD-GILYARD, LCSW 01/07/2019, 1:03 PM  

## 2019-01-07 NOTE — Anesthesia Postprocedure Evaluation (Signed)
Anesthesia Post Note  Patient: Laura Whitaker  Procedure(s) Performed: AN AD HOC LABOR EPIDURAL     Patient location during evaluation: Mother Baby Anesthesia Type: Epidural Level of consciousness: awake Pain management: pain level controlled Vital Signs Assessment: post-procedure vital signs reviewed and stable Respiratory status: spontaneous breathing Cardiovascular status: stable Postop Assessment: patient able to bend at knees, epidural receding, no headache and no backache Anesthetic complications: no    Last Vitals:  Vitals:   01/07/19 0200 01/07/19 0559  BP: 104/65 108/72  Pulse: 87 69  Resp: 16 16  Temp: 37.2 C 36.7 C  SpO2: 99% 99%    Last Pain:  Vitals:   01/07/19 0816  TempSrc:   PainSc: 0-No pain   Pain Goal: Patients Stated Pain Goal: 0 (01/06/19 0113)               Edison Pace

## 2019-01-08 MED ORDER — SENNOSIDES-DOCUSATE SODIUM 8.6-50 MG PO TABS: 2.0000 | ORAL_TABLET | ORAL | 0 refills | Status: AC

## 2019-01-08 MED ORDER — IBUPROFEN 600 MG PO TABS: 600.0000 mg | ORAL_TABLET | Freq: Four times a day (QID) | ORAL | 0 refills | Status: AC

## 2019-01-08 NOTE — Discharge Summary (Addendum)
OB Discharge Summary     Patient Name: Laura Whitaker DOB: Jul 19, 1996 MRN: [id number]  Date of admission: 01/06/2019 Delivering MD: Arvilla Market   Date of discharge: 01/08/2019  Admitting diagnosis: 40.2WKS BLEEDING Intrauterine pregnancy: [redacted]w[redacted]d     Secondary diagnosis:  Active Problems:   Normal labor and delivery   Depression affecting pregnancy   Marijuana use  Additional problems: THC in first trimester, Anxiety/depression in Mom, suspected Triple I at delivery     Discharge diagnosis: Term Pregnancy Delivered                                                                                                Post partum procedures:None  Augmentation: AROM, Pitocin, Cytotec and Foley Balloon  Complications: None  Hospital course:  Onset of Labor With Vaginal Delivery     23 y.o. yo [id number] at [redacted]w[redacted]d was admitted in Latent Labor on 01/06/2019. Patient had an uncomplicated labor course as follows:  Membrane Rupture Time/Date: 9:50 AM ,01/06/2019   Intrapartum Procedures: Episiotomy: None [1]                                         Lacerations:  Labial [10]  Patient had a delivery of a Viable infant. 01/06/2019  Information for the patient's newborn:  Rheann, Boehnlein Girl Yasmyn [[id number]]  Delivery Method: Vaginal, Spontaneous(Filed from Delivery Summary)    Pateint had an uncomplicated postpartum course.  She is ambulating, tolerating a regular diet, passing flatus, and urinating well. Patient is discharged home in stable condition on 01/08/19.   Physical exam  Vitals:   01/07/19 0559 01/07/19 1405 01/07/19 2158 01/08/19 0609  BP: 108/72 105/69 127/76 114/80  Pulse: 69 73 87 73  Resp: 16 16  18   Temp: 98 F (36.7 C) 98.1 F (36.7 C) 98 F (36.7 C) 98.1 F (36.7 C)  TempSrc: Oral Oral Oral Oral  SpO2: 99%  97% 99%  Weight:      Height:       General: alert, cooperative and no distress Lochia: appropriate Uterine Fundus: firm Incision: N/A DVT Evaluation: No  evidence of DVT seen on physical exam. Negative Homan's sign. No cords or calf tenderness. No significant calf/ankle edema. Labs: Lab Results  Component Value Date   WBC 16.9 (H) 01/06/2019   HGB 13.2 01/06/2019   HCT 39.5 01/06/2019   MCV 90.2 01/06/2019   PLT 287 01/06/2019   CMP Latest Ref Rng & Units 03/04/2017  Glucose 65 - 99 mg/dL 85  BUN 6 - 20 mg/dL 16  Creatinine 3.38 - 2.50 mg/dL 5.39  Sodium 767 - 341 mmol/L 139  Potassium 3.5 - 5.1 mmol/L 3.9  Chloride 101 - 111 mmol/L 106  CO2 22 - 32 mmol/L 27  Calcium 8.9 - 10.3 mg/dL 9.5  Total Protein 6.5 - 8.1 g/dL 7.6  Total Bilirubin 0.3 - 1.2 mg/dL 0.7  Alkaline Phos 38 - 126 U/L 55  AST 15 - 41 U/L 24  ALT 14 - 54  U/L 25    Discharge instruction: per After Visit Summary and "Baby and Me Booklet".  After visit meds:  Allergies as of 01/08/2019      Reactions   Pineapple Anaphylaxis   Amoxicillin Hives   Coconut Oil Hives   Penicillins Hives, Swelling   DID THE REACTION INVOLVE: Swelling of the face/tongue/throat, SOB, or low BP? Yes Sudden or severe rash/hives, skin peeling, or the inside of the mouth or nose? Yes Did it require medical treatment? No When did it last happen?childhood If all above answers are "NO", may proceed with cephalosporin use.   Prednisone Hives      Medication List    TAKE these medications   ibuprofen 600 MG tablet Commonly known as:  ADVIL,MOTRIN Take 1 tablet (600 mg total) by mouth every 6 (six) hours.   senna-docusate 8.6-50 MG tablet Commonly known as:  Senokot-S Take 2 tablets by mouth daily. Start taking on:  January 09, 2019   sertraline 50 MG tablet Commonly known as:  ZOLOFT Take 50 mg by mouth daily.      Diet: routine diet  Activity: Advance as tolerated. Pelvic rest for 6 weeks.   Outpatient follow up:2 weeks for mom; baby 01/09/2019 at 2:20pm at [facility] [address] [place], [place] [address] Follow up Appt:No future appointments.  Postpartum  contraception: Undecided  Newborn Data: Live born female  Birth Weight: 7 lb 6.9 oz (3371 g) APGAR: 8, 9  Newborn Delivery   Birth date/time:  01/06/2019 15:31:00 Delivery type:  Vaginal, Spontaneous    Baby Feeding: Breast Disposition:home with mother  01/08/2019 Dollene Cleveland, DO  OB FELLOW DISCHARGE ATTESTATION  I have seen and examined this patient and agree with above documentation in the resident's note.   Marcy Siren, D.O. OB Fellow  01/08/2019, 9:45 PM

## 2019-01-08 NOTE — Lactation Note (Signed)
This note was copied from a baby's chart. Lactation Consultation Note  Patient Name: Laura Whitaker [id number] Date: 01/08/2019 Reason for consult: Follow-up assessment Reviewed basics and answered questions. Instructed to feed with cues and use good breast massage during feeding.  Mom has a manual pump for home use.  Discussed milk coming to volume and the prevention and treatment of engorgement.  Recommended keeping a feeding diary for first week.  Lactation outpatient services and support reviewed and encouraged prn.  Maternal Data    Feeding Feeding Type: Breast Fed  LATCH Score                   Interventions    Lactation Tools Discussed/Used     Consult Status Consult Status: Complete Follow-up type: Call as needed    Huston Foley 01/08/2019, 9:17 AM

## 2019-01-14 ENCOUNTER — Ambulatory Visit: Payer: Medicaid Other | Admitting: Family Medicine

## 2019-07-02 ENCOUNTER — Telehealth: Payer: Self-pay | Admitting: Women's Health

## 2019-07-02 NOTE — Telephone Encounter (Signed)

## 2019-07-06 ENCOUNTER — Encounter: Payer: Medicaid Other | Admitting: Women's Health
